# Patient Record
Sex: Female | Born: 1965 | Race: White | Hispanic: No | Marital: Married | Smoking: Never smoker
Health system: Southern US, Community
[De-identification: ages and names within clinical notes are randomized; demographics above are authoritative.]

## PROBLEM LIST (undated history)

## (undated) DIAGNOSIS — E7800 Pure hypercholesterolemia, unspecified: Secondary | ICD-10-CM

## (undated) DIAGNOSIS — Z9884 Bariatric surgery status: Secondary | ICD-10-CM

## (undated) DIAGNOSIS — E663 Overweight: Secondary | ICD-10-CM

## (undated) DIAGNOSIS — F3132 Bipolar disorder, current episode depressed, moderate: Secondary | ICD-10-CM

## (undated) DIAGNOSIS — K562 Volvulus: Secondary | ICD-10-CM

## (undated) DIAGNOSIS — R748 Abnormal levels of other serum enzymes: Secondary | ICD-10-CM

## (undated) DIAGNOSIS — K219 Gastro-esophageal reflux disease without esophagitis: Secondary | ICD-10-CM

## (undated) HISTORY — DX: Gastro-esophageal reflux disease without esophagitis: K21.9

## (undated) HISTORY — DX: Bariatric surgery status: Z98.84

## (undated) HISTORY — PX: BREAST EXCISIONAL BIOPSY: SUR124

## (undated) HISTORY — DX: Abnormal levels of other serum enzymes: R74.8

## (undated) HISTORY — PX: OTHER SURGICAL HISTORY: SHX169

## (undated) HISTORY — PX: APPENDECTOMY: SHX54

## (undated) HISTORY — DX: Overweight: E66.3

## (undated) HISTORY — DX: Pure hypercholesterolemia, unspecified: E78.00

## (undated) HISTORY — DX: Volvulus: K56.2

## (undated) HISTORY — DX: Bipolar disorder, current episode depressed, moderate: F31.32

## (undated) HISTORY — PX: CHOLECYSTECTOMY: SHX55

## (undated) HISTORY — PX: ABDOMINAL HYSTERECTOMY: SHX81

## (undated) HISTORY — PX: HERNIA REPAIR: SHX51

## (undated) HISTORY — PX: PATELLA REALIGNMENT: SHX2179

---

## 2008-09-17 HISTORY — PX: GASTRIC BYPASS: SHX52

## 2011-05-14 ENCOUNTER — Ambulatory Visit: Payer: Self-pay | Admitting: Internal Medicine

## 2013-02-02 HISTORY — PX: COLONOSCOPY W/ BIOPSIES: SHX1374

## 2013-03-20 ENCOUNTER — Ambulatory Visit: Payer: Self-pay | Admitting: Family Medicine

## 2013-06-10 DIAGNOSIS — K562 Volvulus: Secondary | ICD-10-CM

## 2013-06-10 HISTORY — DX: Volvulus: K56.2

## 2014-04-22 DIAGNOSIS — F3132 Bipolar disorder, current episode depressed, moderate: Secondary | ICD-10-CM | POA: Insufficient documentation

## 2014-04-22 HISTORY — DX: Bipolar disorder, current episode depressed, moderate: F31.32

## 2014-10-02 DIAGNOSIS — Z9884 Bariatric surgery status: Secondary | ICD-10-CM | POA: Insufficient documentation

## 2014-10-14 DIAGNOSIS — Z9884 Bariatric surgery status: Secondary | ICD-10-CM | POA: Insufficient documentation

## 2014-10-14 HISTORY — DX: Bariatric surgery status: Z98.84

## 2014-11-14 HISTORY — PX: ESOPHAGOGASTRODUODENOSCOPY: SHX1529

## 2015-03-11 ENCOUNTER — Other Ambulatory Visit: Payer: Self-pay | Admitting: Family Medicine

## 2015-03-11 DIAGNOSIS — Z1231 Encounter for screening mammogram for malignant neoplasm of breast: Secondary | ICD-10-CM

## 2015-03-25 ENCOUNTER — Ambulatory Visit: Payer: Self-pay

## 2015-03-31 ENCOUNTER — Other Ambulatory Visit: Payer: Self-pay | Admitting: Family Medicine

## 2015-03-31 ENCOUNTER — Ambulatory Visit
Admission: RE | Admit: 2015-03-31 | Discharge: 2015-03-31 | Disposition: A | Discharge status: Home / Self Care | Payer: Medicare Other | Source: Ambulatory Visit | Attending: Family Medicine | Admitting: Family Medicine

## 2015-03-31 DIAGNOSIS — Z1231 Encounter for screening mammogram for malignant neoplasm of breast: Secondary | ICD-10-CM | POA: Diagnosis not present

## 2015-04-03 DIAGNOSIS — R748 Abnormal levels of other serum enzymes: Secondary | ICD-10-CM

## 2015-04-03 DIAGNOSIS — E7800 Pure hypercholesterolemia, unspecified: Secondary | ICD-10-CM

## 2015-04-03 HISTORY — DX: Pure hypercholesterolemia, unspecified: E78.00

## 2015-04-03 HISTORY — DX: Abnormal levels of other serum enzymes: R74.8

## 2015-09-11 DIAGNOSIS — E663 Overweight: Secondary | ICD-10-CM

## 2015-09-11 DIAGNOSIS — Z90710 Acquired absence of both cervix and uterus: Secondary | ICD-10-CM | POA: Insufficient documentation

## 2015-09-11 HISTORY — DX: Overweight: E66.3

## 2016-02-23 ENCOUNTER — Other Ambulatory Visit: Payer: Self-pay | Admitting: Family Medicine

## 2016-02-23 DIAGNOSIS — Z1231 Encounter for screening mammogram for malignant neoplasm of breast: Secondary | ICD-10-CM

## 2016-03-31 ENCOUNTER — Ambulatory Visit: Payer: Medicare Other

## 2016-04-05 ENCOUNTER — Ambulatory Visit
Admission: RE | Admit: 2016-04-05 | Discharge: 2016-04-05 | Disposition: A | Discharge status: Home / Self Care | Payer: Medicare Other | Source: Ambulatory Visit | Attending: Family Medicine | Admitting: Family Medicine

## 2016-04-05 DIAGNOSIS — Z1231 Encounter for screening mammogram for malignant neoplasm of breast: Secondary | ICD-10-CM | POA: Insufficient documentation

## 2016-04-08 ENCOUNTER — Other Ambulatory Visit: Payer: Self-pay | Admitting: Family Medicine

## 2016-04-08 DIAGNOSIS — R928 Other abnormal and inconclusive findings on diagnostic imaging of breast: Secondary | ICD-10-CM

## 2016-04-14 ENCOUNTER — Ambulatory Visit: Payer: Medicare Other

## 2016-04-19 HISTORY — PX: BREAST EXCISIONAL BIOPSY: SUR124

## 2016-04-22 ENCOUNTER — Ambulatory Visit
Admission: RE | Admit: 2016-04-22 | Discharge: 2016-04-22 | Disposition: A | Discharge status: Home / Self Care | Payer: Medicare Other | Source: Ambulatory Visit | Attending: Family Medicine | Admitting: Family Medicine

## 2016-04-22 DIAGNOSIS — R928 Other abnormal and inconclusive findings on diagnostic imaging of breast: Secondary | ICD-10-CM

## 2016-04-23 ENCOUNTER — Other Ambulatory Visit: Payer: Self-pay | Admitting: Family Medicine

## 2016-04-23 DIAGNOSIS — R928 Other abnormal and inconclusive findings on diagnostic imaging of breast: Secondary | ICD-10-CM

## 2016-05-05 ENCOUNTER — Ambulatory Visit
Admission: RE | Admit: 2016-05-05 | Discharge: 2016-05-05 | Disposition: A | Discharge status: Home / Self Care | Payer: Medicare Other | Source: Ambulatory Visit | Attending: Family Medicine | Admitting: Family Medicine

## 2016-05-05 DIAGNOSIS — R928 Other abnormal and inconclusive findings on diagnostic imaging of breast: Secondary | ICD-10-CM

## 2016-05-05 HISTORY — PX: BREAST BIOPSY: SHX20

## 2016-05-06 ENCOUNTER — Ambulatory Visit: Payer: Medicare Other

## 2016-05-06 LAB — SURGICAL PATHOLOGY

## 2016-05-07 ENCOUNTER — Telehealth: Payer: Self-pay | Admitting: Surgery

## 2016-05-07 NOTE — Telephone Encounter (Signed)
Patient had a needle biopsy and now they want to remove more tissue. What does this involve. Not a patient here yet. Please call.

## 2016-05-10 ENCOUNTER — Other Ambulatory Visit: Payer: Self-pay

## 2016-05-10 ENCOUNTER — Ambulatory Visit (INDEPENDENT_AMBULATORY_CARE_PROVIDER_SITE_OTHER): Payer: Medicare Other | Admitting: General Surgery

## 2016-05-10 ENCOUNTER — Encounter: Payer: Self-pay | Admitting: General Surgery

## 2016-05-10 VITALS — BP 115/78 | HR 105 | Temp 98.5°F | Ht 70.0 in | Wt 166.2 lb

## 2016-05-10 DIAGNOSIS — N6320 Unspecified lump in the left breast, unspecified quadrant: Secondary | ICD-10-CM | POA: Diagnosis not present

## 2016-05-10 NOTE — Progress Notes (Signed)
Patient ID: Brandi Lawson, female   DOB: 01/16/1966, 51 y.o.   MRN: [id number]  CC: left breast mass  HPI Brandi Lawson BargeLynne Lawson is a 51 y.o. female who presents to clinic today for evaluation of a left breast mass. Patient reports that she had a screening mammogram earlier this year which showed an unusual finding of the left breast. It was subsequently biopsied. The biopsy returned benign tissue without evidence of hyperplasia. This was deemed to be a disconcordant finding and she was referred here for discussion of a surgical excision of the area. Patient reports that she has never felt a lump or mass in the area of concern. Her only previous breast biopsy was on the opposite breast. She denies any known family history of breast cancer. She denies any fevers, chills, nausea, vomiting, chest pain, shortness of breath, diarrhea, constipation. She is status post bariatric surgery and has lost over 100 pounds. She is otherwise in her usual state of health.  HPI  Past Medical History:  Diagnosis Date  . Bipolar affective disorder, depressed, moderate (HCC) 04/22/2014  . 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 Elevated liver enzymes 04/03/2015  . GERD (gastroesophageal reflux disease)   . Hypercholesterolemia 04/03/2015  . Overweight (BMI 25.0-29.9) 09/11/2015  . 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 ABDOMINAL HYSTERECTOMY     Not due to Cancer  . APPENDECTOMY    . BREAST EXCISIONAL BIOPSY Right 6+yrs ago   neg  . CHOLECYSTECTOMY    . Colocutaneous Fistula Closure    . COLONOSCOPY W/ BIOPSIES  02/02/2013   [facility]- Dr. Doy HutchingMcGreal  . ESOPHAGOGASTRODUODENOSCOPY  11/14/2014   [facility]- Dr. IraqSudan  . GASTRIC BYPASS  09/2008  . PATELLA REALIGNMENT Right     Family History  Problem Relation Age of Onset  . Hyperlipidemia Mother   . Hypertension Mother   . Thyroid disease Mother   . Diabetes Father   . Bipolar  disorder Father   . Hyperlipidemia Father   . Hypertension Father   . Thyroid disease Father   . Congenital heart disease Brother   . Ulcerative colitis Daughter   . Bipolar disorder Paternal Aunt   . Diabetes Paternal Aunt   . Bipolar disorder Paternal Uncle   . Prostate cancer Maternal Grandfather   . Diabetes Maternal Grandfather   . Prostate cancer Paternal Grandfather   . Bipolar disorder Paternal Grandfather   . Anesthesia problems Neg Hx   . Malignant hypertension Neg Hx     Social History Social History  Substance Use Topics  . Smoking status: Never Smoker  . Smokeless tobacco: Never Used  . Alcohol use No    Allergies  Allergen Reactions  . Other Anaphylaxis    Bee stings  . Gluten Meal Other (See Comments)    sensitivity  . Codeine Other (See Comments)    Causes Anger  . Hydrocodone Itching  . Meperidine Itching    Current Outpatient Prescriptions  Medication Sig Dispense Refill  . ARIPiprazole (ABILIFY) 5 MG tablet Take 5 mg by mouth daily.    . cholecalciferol (VITAMIN D) 1000 units tablet Take 1,000 Units by mouth daily.    . ferrous sulfate 325 (65 FE) MG EC tablet Take 325 mg by mouth 3 (three) times daily with meals.    Marland Kitchen. FETZIMA 80 MG CP24 Take 1 capsule by mouth daily.  8  . gabapentin (NEURONTIN) 600 MG tablet Take  600 mg by mouth 3 (three) times daily.  6  . LORazepam (ATIVAN) 0.5 MG tablet Take 1 tablet by mouth 2 (two) times daily as needed.  0  . omeprazole (PRILOSEC) 40 MG capsule Take 1 capsule by mouth 2 (two) times daily.  99  . QUEtiapine (SEROQUEL) 50 MG tablet Take 1-3 tablets by mouth at bedtime as needed.  3  . simvastatin (ZOCOR) 10 MG tablet Take 10 mg by mouth at bedtime.  99  . vitamin A 40981 UNIT capsule Take 10,000 Units by mouth daily.    . Vitamin K, Phytonadione, 100 MCG TABS Take by mouth.     No current facility-administered medications for this visit.      Review of Systems A Multi-point review of systems was asked  and was negative except for the findings documented in the history of present illness  Physical Exam Blood pressure 115/78, pulse (!) 105, temperature 98.5 F (36.9 C), temperature source Oral, height 5\' 10"  (1.778 m), weight 75.4 kg (166 lb 3.2 oz). CONSTITUTIONAL: No acute distress. EYES: Pupils are equal, round, and reactive to light, Sclera are non-icteric. EARS, NOSE, MOUTH AND THROAT: The oropharynx is clear. The oral mucosa is pink and moist. Hearing is intact to voice. LYMPH NODES:  Lymph nodes in the neck are normal. BREAST: Bilateral breast examined. Evidence of previous biopsy seen on the left lateral breast. There are no palpable dominant lesions or masses on either breast or axilla.3 RESPIRATORY:  Lungs are clear. There is normal respiratory effort, with equal breath sounds bilaterally, and without pathologic use of accessory muscles. CARDIOVASCULAR: Heart is regular without murmurs, gallops, or rubs. GI: The abdomen is soft, nontender, and nondistended. There are no palpable masses. There is no hepatosplenomegaly. There are normal bowel sounds in all quadrants. GU: Rectal deferred.   MUSCULOSKELETAL: Normal muscle strength and tone. No cyanosis or edema.   SKIN: Turgor is good and there are no pathologic skin lesions or ulcers. NEUROLOGIC: Motor and sensation is grossly normal. Cranial nerves are grossly intact. PSYCH:  Oriented to person, place and time. Affect is normal.  Data Reviewed Images and labs reviewed. Labs do show an area of increased density to the left breast. Pathology from the biopsy revealed focal dense fibrosis that was negative for proliferative changes, atypia, malignancy. I have personally reviewed the patient's imaging, laboratory findings and medical records.    Assessment    Left breast mass    Plan    51 year old female with a left breast mass seen on imaging with needle biopsy thought to be disconcordant. Given this a surgical excision was  recommended. The procedure of a wire localized lumpectomy was described in detail to include the risks, benefits, alternatives. The possible diagnoses were described to the patient in detail. She voiced understanding and desires to proceed as soon as possible due to an upcoming temporary move to support her daughter. Plan will be to proceed to the operating room on Thursday the 25th for needle localized left-sided biopsy.     Time spent with the patient was 45 minutes, with more than 50% of the time spent in face-to-face education, counseling and care coordination.     Ricarda Frame, MD FACS General Surgeon 05/10/2016, 3:38 PM

## 2016-05-10 NOTE — Telephone Encounter (Signed)
Spoke with Dr. Tonita CongWoodham. He would like to see patient at 3pm this afternoon and discuss surgery for later this week.  Patient placed on schedule and patient has been given all appointment details including directions to office.

## 2016-05-10 NOTE — Patient Instructions (Signed)
We have spoken today about removing a lump in your breast. We will have this done on 05/13/16 by Dr[facility].  You will most likely be able to leave the hospital several hours after your surgery.  Plan to tenatively be off work for 1-2 weeks following the surgery and may return with approximately 4 more weeks of a lifting restriction, no greater than 15 lbs.    Lumpectomy A lumpectomy is a form of "breast conserving" or "breast preservation" surgery. It may also be referred to as a partial mastectomy. During a lumpectomy, the portion of the breast that contains the cancerous tumor or breast mass (the lump) is removed. Some normal tissue around the lump may also be removed to make sure all of the tumor has been removed.  LET [facility] PROVID[facility] KNOW ABOUT:  Any allergies you have.  All medicines you are taking, including vitamins, herbs, eye drops, creams, and over-the-counter medicines.  Previous problems you or members of your family have had with the use of anesthetics.  Any blood disorders you have.  Previous surgeries you have had.  Medical conditions you have. RISKS AND COMPLICATIONS Generally, this is a safe procedure. However, problems can occur and include:  Bleeding.  Infection.  Pain.  Temporary swelling.  Change in the shape of the breast, particularly if a large portion is removed. BEFORE THE PROCEDURE  Ask your health care provider about changing or stopping your regular medicines. This is especially important if you are taking diabetes medicines or blood thinners.  Do not eat or drink anything after midnight on the night before the procedure or as directed by your health care provider. Ask your health care provider if you can take a sip of water with any approved medicines.  On the day of surgery, your health care provider will use a mammogram or ultrasound to locate and mark the tumor in your breast. These markings on your breast will show where  the cut (incision) will be made. PROCEDURE   An IV tube will be put into one of your veins.  You may be given medicine to help you relax before the surgery (sedative). You will be given one of the following:  A medicine that numbs the area (local anesthetic).  A medicine that makes you fall asleep (general anesthetic).  Your health care provider will use a kind of electric scalpel that uses heat to minimize bleeding (electrocautery knife).  A curved incision (like a smile or frown) that follows the natural curve of your breast is made, to allow for minimal scarring and better healing.  The tumor will be removed with some of the surrounding tissue. This will be sent to the lab for analysis. Your health care provider may also remove your lymph nodes at this time if needed.  Sometimes, but not always, a rubber tube called a drain will be surgically inserted into your breast area or armpit to collect excess fluid that may accumulate in the space where the tumor was. This drain is connected to a plastic bulb on the outside of your body. This drain creates suction to help remove the fluid.  The incisions will be closed with stitches (sutures).  A bandage may be placed over the incisions. AFTER THE PROCEDURE  You will be taken to the recovery area.  You will be given medicine for pain.  A small rubber drain may be placed in the breast for 2-3 days to prevent a collection of blood (hematoma) from  developing in the breast. You will be given instructions on caring for the drain before you go home.  A pressure bandage (dressing) will be applied for 1-2 days to prevent bleeding. Ask your health care provider how to care for your bandage at home.   This information is not intended to replace advice given to you by your health care provider. Make sure you discuss any questions you have with your health care provider.   Document Released: 05/17/2006 Document Revised: 04/26/2014 Document Reviewed:  09/08/2012 Elsevier Interactive Patient Education [employer].

## 2016-05-10 NOTE — Telephone Encounter (Signed)
Spoke with patient at this time. She would like to have this taken care of as soon as possible. However, she will be leaving to go out of town indefinitely on 06/01/16 and would like this taken care of prior to that. (Her daughter is delivering grandchild soon and son-in-law is deployed).  Spoke with Dr. Tonita CongWoodham in regards to seeing patient today or tomorrow in office and putting on surgical schedule later this week. He will call me back after he has decided on plan.

## 2016-05-11 ENCOUNTER — Other Ambulatory Visit: Payer: Self-pay | Admitting: General Surgery

## 2016-05-11 ENCOUNTER — Encounter
Admission: RE | Admit: 2016-05-11 | Discharge: 2016-05-11 | Disposition: A | Discharge status: Home / Self Care | Payer: Medicare Other | Source: Ambulatory Visit | Attending: General Surgery | Admitting: General Surgery

## 2016-05-11 ENCOUNTER — Other Ambulatory Visit: Payer: Self-pay

## 2016-05-11 ENCOUNTER — Ambulatory Visit
Admission: RE | Admit: 2016-05-11 | Discharge: 2016-05-11 | Disposition: A | Discharge status: Home / Self Care | Payer: Medicare Other | Source: Ambulatory Visit | Attending: General Surgery | Admitting: General Surgery

## 2016-05-11 ENCOUNTER — Telehealth: Payer: Self-pay | Admitting: General Surgery

## 2016-05-11 DIAGNOSIS — N6320 Unspecified lump in the left breast, unspecified quadrant: Secondary | ICD-10-CM | POA: Diagnosis not present

## 2016-05-11 DIAGNOSIS — Z01810 Encounter for preprocedural cardiovascular examination: Secondary | ICD-10-CM

## 2016-05-11 DIAGNOSIS — Z0183 Encounter for blood typing: Secondary | ICD-10-CM | POA: Diagnosis not present

## 2016-05-11 DIAGNOSIS — Z01811 Encounter for preprocedural respiratory examination: Secondary | ICD-10-CM

## 2016-05-11 DIAGNOSIS — Z01818 Encounter for other preprocedural examination: Secondary | ICD-10-CM | POA: Diagnosis present

## 2016-05-11 DIAGNOSIS — N63 Unspecified lump in unspecified breast: Secondary | ICD-10-CM

## 2016-05-11 LAB — PROTIME-INR
INR: 1.05
PROTHROMBIN TIME: 13.7 s (ref 11.4–15.2)

## 2016-05-11 LAB — COMPREHENSIVE METABOLIC PANEL
ALK PHOS: 95 U/L (ref 38–126)
ALT: 60 U/L — AB (ref 14–54)
AST: 56 U/L — ABNORMAL HIGH (ref 15–41)
Albumin: 4.3 g/dL (ref 3.5–5.0)
Anion gap: 7 (ref 5–15)
BILIRUBIN TOTAL: 0.7 mg/dL (ref 0.3–1.2)
BUN: 16 mg/dL (ref 6–20)
CALCIUM: 8.9 mg/dL (ref 8.9–10.3)
CO2: 29 mmol/L (ref 22–32)
Chloride: 104 mmol/L (ref 101–111)
Creatinine, Ser: 0.6 mg/dL (ref 0.44–1.00)
GFR calc Af Amer: >60 mL/min (ref >60)
Glucose, Bld: 108 mg/dL — ABNORMAL HIGH (ref 65–99)
Potassium: 3.7 mmol/L (ref 3.5–5.1)
Sodium: 140 mmol/L (ref 135–145)
TOTAL PROTEIN: 7.5 g/dL (ref 6.5–8.1)

## 2016-05-11 LAB — IRON AND TIBC
Iron: 51 ug/dL (ref 28–170)
Saturation Ratios: 12 % (ref 10.4–31.8)
TIBC: 440 ug/dL (ref 250–450)
UIBC: 389 ug/dL

## 2016-05-11 LAB — CBC WITH DIFFERENTIAL/PLATELET
Basophils Absolute: 0 10*3/uL (ref 0–0.1)
Basophils Relative: 1 %
Eosinophils Absolute: 0.1 10*3/uL (ref 0–0.7)
Eosinophils Relative: 2 %
HCT: 38.8 % (ref 35.0–47.0)
HEMOGLOBIN: 13 g/dL (ref 12.0–16.0)
LYMPHS ABS: 1.7 10*3/uL (ref 1.0–3.6)
LYMPHS PCT: 27 %
MCH: 29.1 pg (ref 26.0–34.0)
MCHC: 33.5 g/dL (ref 32.0–36.0)
MCV: 86.8 fL (ref 80.0–100.0)
MONOS PCT: 6 %
Monocytes Absolute: 0.4 10*3/uL (ref 0.2–0.9)
NEUTROS PCT: 64 %
Neutro Abs: 3.9 10*3/uL (ref 1.4–6.5)
Platelets: 283 10*3/uL (ref 150–440)
RBC: 4.47 MIL/uL (ref 3.80–5.20)
RDW: 13.1 % (ref 11.5–14.5)
WBC: 6.1 10*3/uL (ref 3.6–11.0)

## 2016-05-11 LAB — APTT: aPTT: 33 seconds (ref 24–36)

## 2016-05-11 NOTE — Patient Instructions (Signed)
Your procedure is scheduled on: Thursday 05/13/16 [facility] AT 8:15 AM    Remember: Instructions that are not followed completely may result in serious medical risk, up to and including death, or upon the discretion of your surgeon and anesthesiologist your surgery may need to be rescheduled.    __X__ 1. Do not eat food or drink liquids after midnight. No gum chewing or hard candies.     __X__ 2. No Alcohol for 24 hours before or after surgery.   ____ 3. Bring all medications with you on the day of surgery if instructed.    __X__ 4. Notify your doctor if there is any change in your medical condition     (cold, fever, infections).             ___X__5. No smoking within 24 hours of your surgery.     Do not wear jewelry, make-up, hairpins, clips or nail polish.  Do not wear lotions, powders, or perfumes.   Do not shave 48 hours prior to surgery. Men may shave face and neck.  Do not bring valuables to the hospital.    [facility] is not responsible for any belongings or valuables.               Contacts, dentures or bridgework may not be worn into surgery.  Leave your suitcase in the car. After surgery it may be brought to your room.  For patients admitted to the hospital, discharge time is determined by your                treatment team.   Patients discharged the day of surgery will not be allowed to drive home.   Please read over the following fact sheets that you were given:   Pain Booklet and MRSA Information   __X_ Take these medicines the morning of surgery with A SIP OF WATER:    1. GABAPENTIN  2. FETZIMA  3. OMPERAZOLE  4.   5.  6.  ____ Fleet Enema (as directed)   __X__ Use CHG Soap as directed  ____ Use inhalers on the day of surgery  ____ Stop metformin 2 days prior to surgery    ____ Take 1/2 of usual insulin dose the night before surgery and none on the morning of surgery.   ____ Stop Coumadin/Plavix/aspirin on   __X__ Stop  Anti-inflammatories such as Advil, Aleve, Ibuprofen, Motrin, Naproxen, Naprosyn, Goodies,powder, or aspirin products.  OK to take Tylenol.   ____ Stop supplements until after surgery.    ____ Bring C-Pap to the hospital.

## 2016-05-11 NOTE — Telephone Encounter (Signed)
Pt advised of pre op date/time and sx date. Sx: 05/13/16 with Dr Ambrose MantleWoodham--Left lumpectomy with Needle localization.  Pre op: 05/11/16 @ 1:00pm--Office.   Patient made aware to arrive at 8:15am the day of surgery [facility].   Patient understands all directions.

## 2016-05-12 MED ORDER — CEFAZOLIN SODIUM-DEXTROSE 2-4 GM/100ML-% IV SOLN
2.0000 g | INTRAVENOUS | Status: AC
  Administered 2016-05-13: 2 g via INTRAVENOUS

## 2016-05-13 ENCOUNTER — Ambulatory Visit
Admission: RE | Admit: 2016-05-13 | Discharge: 2016-05-13 | Disposition: A | Discharge status: Home / Self Care | Payer: Medicare Other | Source: Ambulatory Visit | Attending: General Surgery | Admitting: General Surgery

## 2016-05-13 ENCOUNTER — Encounter: Payer: Self-pay | Admitting: Anesthesiology

## 2016-05-13 ENCOUNTER — Ambulatory Visit: Payer: Medicare Other | Admitting: Certified Registered"

## 2016-05-13 ENCOUNTER — Encounter
Admission: RE | Disposition: A | Discharge status: Home / Self Care | Payer: Self-pay | Source: Ambulatory Visit | Attending: General Surgery

## 2016-05-13 DIAGNOSIS — N6320 Unspecified lump in the left breast, unspecified quadrant: Secondary | ICD-10-CM

## 2016-05-13 DIAGNOSIS — E7800 Pure hypercholesterolemia, unspecified: Secondary | ICD-10-CM | POA: Diagnosis not present

## 2016-05-13 DIAGNOSIS — K219 Gastro-esophageal reflux disease without esophagitis: Secondary | ICD-10-CM | POA: Insufficient documentation

## 2016-05-13 DIAGNOSIS — F319 Bipolar disorder, unspecified: Secondary | ICD-10-CM | POA: Diagnosis not present

## 2016-05-13 DIAGNOSIS — F329 Major depressive disorder, single episode, unspecified: Secondary | ICD-10-CM | POA: Diagnosis not present

## 2016-05-13 DIAGNOSIS — N6012 Diffuse cystic mastopathy of left breast: Secondary | ICD-10-CM | POA: Insufficient documentation

## 2016-05-13 DIAGNOSIS — Z9884 Bariatric surgery status: Secondary | ICD-10-CM | POA: Insufficient documentation

## 2016-05-13 HISTORY — PX: BREAST LUMPECTOMY WITH NEEDLE LOCALIZATION: SHX5759

## 2016-05-13 SURGERY — BREAST LUMPECTOMY WITH NEEDLE LOCALIZATION
Anesthesia: General | Laterality: Left | Wound class: Clean

## 2016-05-13 MED ORDER — DEXAMETHASONE SODIUM PHOSPHATE 10 MG/ML IJ SOLN
INTRAMUSCULAR | Status: DC | PRN
  Administered 2016-05-13: 4 mg via INTRAVENOUS

## 2016-05-13 MED ORDER — DOCUSATE SODIUM 100 MG PO CAPS: 100.0000 mg | ORAL_CAPSULE | Freq: Two times a day (BID) | ORAL | 0 refills | Status: DC

## 2016-05-13 MED ORDER — ONDANSETRON 4 MG PO TBDP: 4.0000 mg | ORAL_TABLET | Freq: Three times a day (TID) | ORAL | 0 refills | Status: DC | PRN

## 2016-05-13 MED ORDER — ONDANSETRON HCL 4 MG/2ML IJ SOLN
INTRAMUSCULAR | Status: DC | PRN
  Administered 2016-05-13: 4 mg via INTRAVENOUS

## 2016-05-13 MED ORDER — LIDOCAINE HCL (PF) 1 % IJ SOLN
INTRAMUSCULAR | Status: AC
  Filled 2016-05-13: qty 30

## 2016-05-13 MED ORDER — CHLORHEXIDINE GLUCONATE CLOTH 2 % EX PADS: 6.0000 | MEDICATED_PAD | Freq: Once | CUTANEOUS | Status: DC

## 2016-05-13 MED ORDER — FENTANYL CITRATE (PF) 100 MCG/2ML IJ SOLN
INTRAMUSCULAR | Status: DC | PRN
  Administered 2016-05-13: 50 ug via INTRAVENOUS
  Administered 2016-05-13: 25 ug via INTRAVENOUS

## 2016-05-13 MED ORDER — BUPIVACAINE HCL (PF) 0.5 % IJ SOLN
INTRAMUSCULAR | Status: AC
  Filled 2016-05-13: qty 30

## 2016-05-13 MED ORDER — ONDANSETRON HCL 4 MG/2ML IJ SOLN
INTRAMUSCULAR | Status: AC
  Filled 2016-05-13: qty 2

## 2016-05-13 MED ORDER — DEXAMETHASONE SODIUM PHOSPHATE 10 MG/ML IJ SOLN
INTRAMUSCULAR | Status: AC
  Filled 2016-05-13: qty 1

## 2016-05-13 MED ORDER — OXYCODONE-ACETAMINOPHEN 2.5-325 MG PO TABS: 1.0000 | ORAL_TABLET | ORAL | 0 refills | Status: DC | PRN

## 2016-05-13 MED ORDER — HEPARIN SODIUM (PORCINE) 1000 UNIT/ML IJ SOLN
INTRAMUSCULAR | Status: AC
  Filled 2016-05-13: qty 1

## 2016-05-13 MED ORDER — ACETAMINOPHEN 10 MG/ML IV SOLN
INTRAVENOUS | Status: AC
  Filled 2016-05-13: qty 100

## 2016-05-13 MED ORDER — FENTANYL CITRATE (PF) 100 MCG/2ML IJ SOLN: 25.0000 ug | INTRAMUSCULAR | Status: DC | PRN

## 2016-05-13 MED ORDER — LIDOCAINE HCL (CARDIAC) 20 MG/ML IV SOLN
INTRAVENOUS | Status: DC | PRN
  Administered 2016-05-13: 80 mg via INTRAVENOUS

## 2016-05-13 MED ORDER — EPINEPHRINE PF 1 MG/ML IJ SOLN
INTRAMUSCULAR | Status: AC
  Filled 2016-05-13: qty 1

## 2016-05-13 MED ORDER — KETOROLAC TROMETHAMINE 30 MG/ML IJ SOLN
INTRAMUSCULAR | Status: AC
  Filled 2016-05-13: qty 1

## 2016-05-13 MED ORDER — CEFAZOLIN SODIUM-DEXTROSE 2-4 GM/100ML-% IV SOLN
INTRAVENOUS | Status: AC
  Administered 2016-05-13: 2 g via INTRAVENOUS
  Filled 2016-05-13: qty 100

## 2016-05-13 MED ORDER — MIDAZOLAM HCL 2 MG/2ML IJ SOLN
INTRAMUSCULAR | Status: AC
  Filled 2016-05-13: qty 2

## 2016-05-13 MED ORDER — FENTANYL CITRATE (PF) 100 MCG/2ML IJ SOLN
INTRAMUSCULAR | Status: AC
  Filled 2016-05-13: qty 2

## 2016-05-13 MED ORDER — PROPOFOL 10 MG/ML IV BOLUS
INTRAVENOUS | Status: DC | PRN
  Administered 2016-05-13: 150 mg via INTRAVENOUS

## 2016-05-13 MED ORDER — PROPOFOL 10 MG/ML IV BOLUS
INTRAVENOUS | Status: AC
  Filled 2016-05-13: qty 20

## 2016-05-13 MED ORDER — ACETAMINOPHEN 10 MG/ML IV SOLN
INTRAVENOUS | Status: DC | PRN
  Administered 2016-05-13: 1000 mg via INTRAVENOUS

## 2016-05-13 MED ORDER — MIDAZOLAM HCL 2 MG/2ML IJ SOLN
INTRAMUSCULAR | Status: DC | PRN
  Administered 2016-05-13: 2 mg via INTRAVENOUS

## 2016-05-13 MED ORDER — LACTATED RINGERS IV SOLN
INTRAVENOUS | Status: DC
  Administered 2016-05-13: 10:00:00 via INTRAVENOUS

## 2016-05-13 MED ORDER — KETOROLAC TROMETHAMINE 30 MG/ML IJ SOLN
INTRAMUSCULAR | Status: DC | PRN
  Administered 2016-05-13: 30 mg via INTRAVENOUS

## 2016-05-13 MED ORDER — LIDOCAINE-EPINEPHRINE (PF) 1 %-1:200000 IJ SOLN
INTRAMUSCULAR | Status: DC | PRN
  Administered 2016-05-13: 15 mL

## 2016-05-13 MED ORDER — BUPIVACAINE HCL (PF) 0.5 % IJ SOLN
INTRAMUSCULAR | Status: DC | PRN
  Administered 2016-05-13: 15 mL

## 2016-05-13 MED ORDER — LIDOCAINE HCL (PF) 2 % IJ SOLN
INTRAMUSCULAR | Status: AC
  Filled 2016-05-13: qty 2

## 2016-05-13 SURGICAL SUPPLY — 29 items
BLADE SURG 15 STRL LF DISP TIS (BLADE) ×1 IMPLANT
BLADE SURG 15 STRL SS (BLADE) ×2
CANISTER SUCT 1200ML W/VALVE (MISCELLANEOUS) ×3 IMPLANT
CHLORAPREP W/TINT 26ML (MISCELLANEOUS) ×3 IMPLANT
COVER PROBE FLX POLY STRL (MISCELLANEOUS) ×3 IMPLANT
DEVICE DUBIN SPECIMEN MAMMOGRA (MISCELLANEOUS) ×3 IMPLANT
DRAPE LAPAROTOMY TRNSV 106X77 (MISCELLANEOUS) ×3 IMPLANT
ELECT CAUTERY BLADE 6.4 (BLADE) ×3 IMPLANT
ELECT REM PT RETURN 9FT ADLT (ELECTROSURGICAL) ×3
ELECTRODE REM PT RTRN 9FT ADLT (ELECTROSURGICAL) ×1 IMPLANT
GLOVE BIO SURGEON STRL SZ7.5 (GLOVE) ×3 IMPLANT
GLOVE INDICATOR 8.0 STRL GRN (GLOVE) ×3 IMPLANT
GOWN STRL REUS W/ TWL LRG LVL3 (GOWN DISPOSABLE) ×1 IMPLANT
GOWN STRL REUS W/ TWL XL LVL3 (GOWN DISPOSABLE) ×1 IMPLANT
GOWN STRL REUS W/TWL LRG LVL3 (GOWN DISPOSABLE) ×2
GOWN STRL REUS W/TWL XL LVL3 (GOWN DISPOSABLE) ×2
LABEL OR SOLS (LABEL) ×3 IMPLANT
LIQUID BAND (GAUZE/BANDAGES/DRESSINGS) ×3 IMPLANT
MARGIN MAP 10MM (MISCELLANEOUS) ×3 IMPLANT
NEEDLE HYPO 25X1 1.5 SAFETY (NEEDLE) ×3 IMPLANT
PACK BASIN MINOR ARMC (MISCELLANEOUS) ×3 IMPLANT
SUT MNCRL 4-0 (SUTURE) ×2
SUT MNCRL 4-0 27XMFL (SUTURE) ×1
SUT SILK 2 0 SH (SUTURE) ×3 IMPLANT
SUT VIC AB 3-0 SH 27 (SUTURE) ×2
SUT VIC AB 3-0 SH 27X BRD (SUTURE) ×1 IMPLANT
SUTURE MNCRL 4-0 27XMF (SUTURE) ×1 IMPLANT
SYRINGE 10CC LL (SYRINGE) ×3 IMPLANT
WATER STERILE IRR 1000ML POUR (IV SOLUTION) ×3 IMPLANT

## 2016-05-13 NOTE — Brief Op Note (Signed)
05/13/2016  11:16 AM  PATIENT:  Brandi Lawson  51 y.o. female  PRE-OPERATIVE DIAGNOSIS:  left breast mass  POST-OPERATIVE DIAGNOSIS:  left breast mass  PROCEDURE:  Procedure(s): BREAST LUMPECTOMY WITH NEEDLE LOCALIZATION (Left)  SURGEON:  Surgeon(s) and Role:    * Ricarda Frameharles Symeon Puleo, MD - Primary  PHYSICIAN ASSISTANT:   ASSISTANTS: PA student   ANESTHESIA:   general  EBL:  No intake/output data recorded.  BLOOD ADMINISTERED:none  DRAINS: none   LOCAL MEDICATIONS USED:  MARCAINE   , XYLOCAINE  and Amount: 30 ml  SPECIMEN:  Source of Specimen:  left breast mass  DISPOSITION OF SPECIMEN:  PATHOLOGY  COUNTS:  YES  TOURNIQUET:  * No tourniquets in log *  DICTATION: .Dragon Dictation  PLAN OF CARE: Discharge to home after PACU  PATIENT DISPOSITION:  PACU - hemodynamically stable.   Delay start of Pharmacological VTE agent (>24hrs) due to surgical blood loss or risk of bleeding: no

## 2016-05-13 NOTE — Anesthesia Postprocedure Evaluation (Signed)
Anesthesia Post Note  Patient: Brandi Lawson  Procedure(s) Performed: Procedure(s) (LRB): BREAST LUMPECTOMY WITH NEEDLE LOCALIZATION (Left)  Patient location during evaluation: PACU Anesthesia Type: General Level of consciousness: awake and alert Pain management: pain level controlled Vital Signs Assessment: post-procedure vital signs reviewed and stable Respiratory status: spontaneous breathing, nonlabored ventilation, respiratory function stable and patient connected to nasal cannula oxygen Cardiovascular status: blood pressure returned to baseline and stable Postop Assessment: no signs of nausea or vomiting Anesthetic complications: no     Last Vitals:  Vitals:   05/13/16 1144 05/13/16 1155  BP: 119/75 124/66  Pulse: 79   Resp: 18 18  Temp:  36.8 C    Last Pain:  Vitals:   05/13/16 1155  TempSrc:   PainSc: 0-No pain                 Cleda MccreedyJoseph K Lilleigh Hechavarria

## 2016-05-13 NOTE — Anesthesia Procedure Notes (Signed)
Procedure Name: LMA Insertion Performed by: Wenona Mayville Pre-anesthesia Checklist: Patient identified, Patient being monitored, Timeout performed, Emergency Drugs available and Suction available Patient Re-evaluated:Patient Re-evaluated prior to inductionOxygen Delivery Method: Circle system utilized Preoxygenation: Pre-oxygenation with 100% oxygen Intubation Type: IV induction LMA: LMA inserted LMA Size: 3.0 Tube type: Oral Number of attempts: 1 Placement Confirmation: positive ETCO2 and breath sounds checked- equal and bilateral Tube secured with: Tape Dental Injury: Teeth and Oropharynx as per pre-operative assessment        

## 2016-05-13 NOTE — Transfer of Care (Signed)
Immediate Anesthesia Transfer of Care Note  Patient: Brandi Lawson  Procedure(s) Performed: Procedure(s): BREAST LUMPECTOMY WITH NEEDLE LOCALIZATION (Left)  Patient Location: PACU  Anesthesia Type:General  Level of Consciousness: sedated  Airway & Oxygen Therapy: Patient Spontanous Breathing and Patient connected to face mask oxygen  Post-op Assessment: Report given to RN and Post -op Vital signs reviewed and stable  Post vital signs: Reviewed and stable  Last Vitals:  Vitals:   05/13/16 0927 05/13/16 1117  BP: 119/74 97/71  Pulse: (!) 59 61  Resp: 16 10  Temp: 37.1 C     Last Pain:  Vitals:   05/13/16 0927  TempSrc: Tympanic         Complications: No apparent anesthesia complications

## 2016-05-13 NOTE — Discharge Instructions (Signed)
Lumpectomy, Care After This sheet gives you information about how to care for yourself after your procedure. Your health care provider may also give you more specific instructions. If you have problems or questions, contact your health care provider. What can I expect after the procedure? After the procedure, it is common to have:  Breast swelling.  Breast tenderness.  Stiffness in your arm or shoulder.  A change in the shape and feel of your breast.  Scar tissue that feels hard to the touch in the area where the lump was removed. Follow these instructions at home: Bathing  Take sponge baths until your health care provider says that you can start showering or bathing. Okay to shower in 24 hours  Do not take baths, swim, or use a hot tub until your health care provider approves.   AMBULATORY SURGERY  DISCHARGE INSTRUCTIONS   1) The drugs that you were given will stay in your system until tomorrow so for the next 24 hours you should not:  A) Drive an automobile B) Make any legal decisions C) Drink any alcoholic beverage   2) You may resume regular meals tomorrow.  Today it is better to start with liquids and gradually work up to solid foods.  You may eat anything you prefer, but it is better to start with liquids, then soup and crackers, and gradually work up to solid foods.   3) Please notify your doctor immediately if you have any unusual bleeding, trouble breathing, redness and pain at the surgery site, drainage, fever, or pain not relieved by medication.    4) Additional Instructions:        Please contact your physician with any problems or Same Day Surgery at [phone], Monday through Friday 6 am to 4 pm, or [place] [facility] number at [phone]. Incision care  Follow instructions from your health care provider about how to take care of your incision. Make sure you:  Wash your hands with soap and water before you change your bandage  (dressing). If soap and water are not available, use hand sanitizer.  Change your dressing as told by your health care provider.  Leave stitches (sutures), skin glue, or adhesive strips in place. These skin closures may need to stay in place for 2 weeks or longer. If adhesive strip edges start to loosen and curl up, you may trim the loose edges. Do not remove adhesive strips completely unless your health care provider tells you to do that.  Check your incision area every day for signs of infection. Check for:  More redness, swelling, or pain.  More fluid or blood.  Warmth.  Pus or a bad smell.  Keep your dressing clean and dry.  If you were sent home with a surgical drain in place, follow instructions from your health care provider about emptying it. Activity  Return to your normal activities as told by your health care provider. Ask your health care provider what activities are safe for you.  Avoid activities that require a lot of energy (are strenuous).  Be careful to avoid any activities that could cause an injury to your arm on the side of your surgery.  Do not lift anything that is heavier than 10 lb (4.5 kg). Avoid lifting with the arm that is on the side of your surgery.  Do not carry heavy objects on your shoulder.  After your drain is removed, you should perform exercises to keep your arm from getting stiff and swollen. Talk  with your health care provider about which exercises are safe for you. General instructions  Take over-the-counter and prescription medicines only as told by your health care provider.  You may eat what you usually do.  Wear a supportive bra as told by your health care provider.  Raise (elevate) your arm above the level of your heart while you are sitting or lying down.  Do not wear tight jewelry on your arm, wrist, or fingers on the side of your surgery. Follow-up  Keep all follow-up visits as told by your health care provider. This is  important.  You may need to be screened for extra fluid around the lymph nodes (lymphedema). Follow instructions from your health care provider about how often you should be checked.  If you had any lymph nodes removed during your procedure, be sure to tell all of your health care providers. This is important information to share before you are involved in certain procedures, such as giving blood or having your blood pressure taken. Contact a health care provider if:  You develop a rash.  You have a fever.  Your pain medicine is not working.  Your swelling, weakness, or numbness in your arm has not improved after a few weeks.  You have new swelling in your breast or arm.  You have more redness, swelling, or pain in your incision area.  You have more fluid or blood coming from your incision.  Your incision feels warm to the touch.  You have pus or a bad smell coming from your incision. Get help right away if:  You have very bad pain in your breast or arm.  You have chest pain.  You have difficulty breathing. This information is not intended to replace advice given to you by your health care provider. Make sure you discuss any questions you have with your health care provider. Document Released: 04/21/2006 Document Revised: 12/17/2015 Document Reviewed: 12/17/2015 Elsevier Interactive Patient Education  2017 [employer]   AMBULATORY SURGERY  DISCHARGE INSTRUCTIONS   5) The drugs that you were given will stay in your system until tomorrow so for the next 24 hours you should not:  D) Drive an automobile E) Make any legal decisions F) Drink any alcoholic beverage   6) You may resume regular meals tomorrow.  Today it is better to start with liquids and gradually work up to solid foods.  You may eat anything you prefer, but it is better to start with liquids, then soup and crackers, and gradually work up to solid foods.   7) Please notify your doctor immediately if you  have any unusual bleeding, trouble breathing, redness and pain at the surgery site, drainage, fever, or pain not relieved by medication.    8) Additional Instructions:        Please contact your physician with any problems or Same Day Surgery at [phone], Monday through Friday 6 am to 4 pm, or [place] at [facility] number at [phone].

## 2016-05-13 NOTE — Anesthesia Preprocedure Evaluation (Addendum)
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   She reports that a dentist told her that it is loose. The patient reports that she can not appreciate the looseness :   Pulmonary neg pulmonary ROS, neg shortness of breath,    Pulmonary exam normal breath sounds clear to auscultation       Cardiovascular Exercise Tolerance: Good (-) angina(-) Past MI and (-) DOE negative cardio ROS Normal cardiovascular exam Rhythm:regular Rate:Normal     Neuro/Psych PSYCHIATRIC DISORDERS Depression Bipolar Disorder negative neurological ROS     GI/Hepatic Neg liver ROS, GERD  Controlled,  Endo/Other  negative endocrine ROS  Renal/GU      Musculoskeletal   Abdominal   Peds  Hematology negative hematology ROS (+)   Anesthesia Other Findings Past Medical History: 04/22/2014: Bipolar affective disorder, depressed, moderat* 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 Doy HutchingMcGreal 11/14/2014: ESOPHAGOGASTRODUODENOSCOPY     Comment: [facility]- Dr.  Ira[place]an 09/2008: GASTRIC BYPASS No date: HERNIA REPAIR No date: PATELLA REALIGNMENT Right     Reproductive/Obstetrics negative OB ROS                            Anesthesia Physical Anesthesia Plan  ASA: III  Anesthesia Plan: General LMA   Post-op Pain Management:    Induction:   Airway Management Planned:   Additional Equipment:   Intra-op Plan:   Post-operative Plan:   Informed Consent: I have reviewed the patients History and Physical, chart, labs and discussed the procedure including the risks, benefits and alternatives for the proposed anesthesia with the patient or authorized representative who has indicated his/her understanding and acceptance.   Dental Advisory Given  Plan Discussed with: Anesthesiologist, CRNA and Surgeon  Anesthesia Plan Comments:         Anesthesia Quick Evaluation

## 2016-05-13 NOTE — Interval H&P Note (Signed)
History and Physical Interval Note:  05/13/2016 9:47 AM  Brandi Lawson BargeLynne Skillern  has presented today for surgery, with the diagnosis of left breast mass  The various methods of treatment have been discussed with the patient and family. After consideration of risks, benefits and other options for treatment, the patient has consented to  Procedure(s): BREAST LUMPECTOMY WITH NEEDLE LOCALIZATION (Left) as a surgical intervention .  The patient's history has been reviewed, patient examined, no change in status, stable for surgery.  I have reviewed the patient's chart and labs.  Questions were answered to the patient's satisfaction.     Ricarda Frameharles Krist Rosenboom

## 2016-05-13 NOTE — H&P (View-Only) (Signed)
Patient ID: Brandi Lawson, female   DOB: 01/16/1966, 51 y.o.   MRN: [id number]  CC: left breast mass  HPI Brandi Lawson BargeLynne Lawson is a 51 y.o. female who presents to clinic today for evaluation of a left breast mass. Patient reports that she had a screening mammogram earlier this year which showed an unusual finding of the left breast. It was subsequently biopsied. The biopsy returned benign tissue without evidence of hyperplasia. This was deemed to be a disconcordant finding and she was referred here for discussion of a surgical excision of the area. Patient reports that she has never felt a lump or mass in the area of concern. Her only previous breast biopsy was on the opposite breast. She denies any known family history of breast cancer. She denies any fevers, chills, nausea, vomiting, chest pain, shortness of breath, diarrhea, constipation. She is status post bariatric surgery and has lost over 100 pounds. She is otherwise in her usual state of health.  HPI  Past Medical History:  Diagnosis Date  . Bipolar affective disorder, depressed, moderate (HCC) 04/22/2014  . 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 Elevated liver enzymes 04/03/2015  . GERD (gastroesophageal reflux disease)   . Hypercholesterolemia 04/03/2015  . Overweight (BMI 25.0-29.9) 09/11/2015  . 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 ABDOMINAL HYSTERECTOMY     Not due to Cancer  . APPENDECTOMY    . BREAST EXCISIONAL BIOPSY Right 6+yrs ago   neg  . CHOLECYSTECTOMY    . Colocutaneous Fistula Closure    . COLONOSCOPY W/ BIOPSIES  02/02/2013   [facility]- Dr. Doy HutchingMcGreal  . ESOPHAGOGASTRODUODENOSCOPY  11/14/2014   [facility]- Dr. IraqSudan  . GASTRIC BYPASS  09/2008  . PATELLA REALIGNMENT Right     Family History  Problem Relation Age of Onset  . Hyperlipidemia Mother   . Hypertension Mother   . Thyroid disease Mother   . Diabetes Father   . Bipolar  disorder Father   . Hyperlipidemia Father   . Hypertension Father   . Thyroid disease Father   . Congenital heart disease Brother   . Ulcerative colitis Daughter   . Bipolar disorder Paternal Aunt   . Diabetes Paternal Aunt   . Bipolar disorder Paternal Uncle   . Prostate cancer Maternal Grandfather   . Diabetes Maternal Grandfather   . Prostate cancer Paternal Grandfather   . Bipolar disorder Paternal Grandfather   . Anesthesia problems Neg Hx   . Malignant hypertension Neg Hx     Social History Social History  Substance Use Topics  . Smoking status: Never Smoker  . Smokeless tobacco: Never Used  . Alcohol use No    Allergies  Allergen Reactions  . Other Anaphylaxis    Bee stings  . Gluten Meal Other (See Comments)    sensitivity  . Codeine Other (See Comments)    Causes Anger  . Hydrocodone Itching  . Meperidine Itching    Current Outpatient Prescriptions  Medication Sig Dispense Refill  . ARIPiprazole (ABILIFY) 5 MG tablet Take 5 mg by mouth daily.    . cholecalciferol (VITAMIN D) 1000 units tablet Take 1,000 Units by mouth daily.    . ferrous sulfate 325 (65 FE) MG EC tablet Take 325 mg by mouth 3 (three) times daily with meals.    Marland Kitchen. FETZIMA 80 MG CP24 Take 1 capsule by mouth daily.  8  . gabapentin (NEURONTIN) 600 MG tablet Take  600 mg by mouth 3 (three) times daily.  6  . LORazepam (ATIVAN) 0.5 MG tablet Take 1 tablet by mouth 2 (two) times daily as needed.  0  . omeprazole (PRILOSEC) 40 MG capsule Take 1 capsule by mouth 2 (two) times daily.  99  . QUEtiapine (SEROQUEL) 50 MG tablet Take 1-3 tablets by mouth at bedtime as needed.  3  . simvastatin (ZOCOR) 10 MG tablet Take 10 mg by mouth at bedtime.  99  . vitamin A 40981 UNIT capsule Take 10,000 Units by mouth daily.    . Vitamin K, Phytonadione, 100 MCG TABS Take by mouth.     No current facility-administered medications for this visit.      Review of Systems A Multi-point review of systems was asked  and was negative except for the findings documented in the history of present illness  Physical Exam Blood pressure 115/78, pulse (!) 105, temperature 98.5 F (36.9 C), temperature source Oral, height 5\' 10"  (1.778 m), weight 75.4 kg (166 lb 3.2 oz). CONSTITUTIONAL: No acute distress. EYES: Pupils are equal, round, and reactive to light, Sclera are non-icteric. EARS, NOSE, MOUTH AND THROAT: The oropharynx is clear. The oral mucosa is pink and moist. Hearing is intact to voice. LYMPH NODES:  Lymph nodes in the neck are normal. BREAST: Bilateral breast examined. Evidence of previous biopsy seen on the left lateral breast. There are no palpable dominant lesions or masses on either breast or axilla.3 RESPIRATORY:  Lungs are clear. There is normal respiratory effort, with equal breath sounds bilaterally, and without pathologic use of accessory muscles. CARDIOVASCULAR: Heart is regular without murmurs, gallops, or rubs. GI: The abdomen is soft, nontender, and nondistended. There are no palpable masses. There is no hepatosplenomegaly. There are normal bowel sounds in all quadrants. GU: Rectal deferred.   MUSCULOSKELETAL: Normal muscle strength and tone. No cyanosis or edema.   SKIN: Turgor is good and there are no pathologic skin lesions or ulcers. NEUROLOGIC: Motor and sensation is grossly normal. Cranial nerves are grossly intact. PSYCH:  Oriented to person, place and time. Affect is normal.  Data Reviewed Images and labs reviewed. Labs do show an area of increased density to the left breast. Pathology from the biopsy revealed focal dense fibrosis that was negative for proliferative changes, atypia, malignancy. I have personally reviewed the patient's imaging, laboratory findings and medical records.    Assessment    Left breast mass    Plan    51 year old female with a left breast mass seen on imaging with needle biopsy thought to be disconcordant. Given this a surgical excision was  recommended. The procedure of a wire localized lumpectomy was described in detail to include the risks, benefits, alternatives. The possible diagnoses were described to the patient in detail. She voiced understanding and desires to proceed as soon as possible due to an upcoming temporary move to support her daughter. Plan will be to proceed to the operating room on Thursday the 25th for needle localized left-sided biopsy.     Time spent with the patient was 45 minutes, with more than 50% of the time spent in face-to-face education, counseling and care coordination.     Ricarda Frame, MD FACS General Surgeon 05/10/2016, 3:38 PM

## 2016-05-13 NOTE — Op Note (Signed)
   Pre-operative Diagnosis: Left breast mass  Post-operative Diagnosis: Same  Procedure performed: Needle localized left breast lumpectomy  Surgeon: Ricarda Frameharles Willie Plain   Assistants: PA student  Anesthesia: General LMA anesthesia  ASA Class: 1  Surgeon: Ricarda Frameharles Rajinder Mesick, MD FACS  Anesthesia: Gen. with endotracheal tube  Assistant: PA student  Procedure Details  The patient was seen again in the Holding Room. The benefits, complications, treatment options, and expected outcomes were discussed with the patient. The risks of bleeding, infection, recurrence of mass, failure to make diagnosis,  diagnosis of cancer, any of which could require further surgery were reviewed with the patient.   The patient was taken to Operating Room, identified as Brandi Lawson and the procedure verified.  A Time Out was held and the above information confirmed.  Prior to the induction of general anesthesia, antibiotic prophylaxis was administered. VTE prophylaxis was in place. General LMA anesthesia was then administered and tolerated well. After the induction, the left breast was prepped with Chloraprep and draped in the sterile fashion. The patient was positioned in the supine position.   The patient had a radiology guided wire placed before coming to the operating room and was prepped into our field. Using the provided mammogram images and ultrasounding of the clip placement a incision was planned halfway between the nipple areolar complex and the extreme lateral placed wire. The skin was localized with a 50-50 mixture of 1% lidocaine 0.5% Marcaine plain and incised with 15 blade scalpel. Using Bovie electrocautery was taken down to the subcutaneous fat and then tunneled laterally to the wire that was pulled into our incision site.  Following the wire a circumferential conical-shaped excision was planned and carried out along the wire for its entire length. The distal tip of the wire was identified during the  dissection and the tissue was incorporated in the excision. Approximately 5 x 2 x 2 cm of tissue was removed. The tissue was removed without difficulty and sent to radiology for mammogram. Under mammogram the clip, wire, dense tissue was confirmed within the specimen.  The excision cavity was then copiously irrigated with sterile water and meticulous hemostasis was ensured. The deep dermal tissue was reapproximated with an interrupted 3-0 Vicryl. The subcutaneous tissue was closed with a 4-0 Monocryl. The skin was then sealed Dermabond.  Patient tolerated the procedure well, there were no immediate complications, all counts were correct at the end of the procedure. She was awoken from general anesthesia and transferred to the PACU in good condition.  Findings: Left breast mass   Estimated Blood Loss: 5 mL         Drains: None         Specimens: Left breast mass          Complications: None                  Condition: Good   Ricarda Frameharles Nesa Distel, MD, FACS

## 2016-05-13 NOTE — Anesthesia Post-op Follow-up Note (Cosign Needed)
Anesthesia QCDR form completed.        

## 2016-05-14 LAB — SURGICAL PATHOLOGY

## 2016-05-17 ENCOUNTER — Telehealth: Payer: Self-pay | Admitting: General Surgery

## 2016-05-17 NOTE — Telephone Encounter (Signed)
Patient has an appointment Thursday but want to know if her results are back yet.

## 2016-05-17 NOTE — Telephone Encounter (Signed)
Returned phone call to patient. Explained to her that pathology results are back and are under review by physician at this time. Also told her that this result is normally reviewed at post-op visit but I will contact physician to find out if pathology results can be given over the phone. If so, I will get back in touch with the patient tomorrow.

## 2016-05-18 NOTE — Telephone Encounter (Signed)
Notified by Dr. Tonita CongWoodham that results of biopsy are benign. Call made to patient. Results given to her. She is very happy with this news. Confirmed patient's appointment time and location on Thursday with Dr. Tonita CongWoodham

## 2016-05-20 ENCOUNTER — Ambulatory Visit (INDEPENDENT_AMBULATORY_CARE_PROVIDER_SITE_OTHER): Payer: Medicare Other | Admitting: General Surgery

## 2016-05-20 ENCOUNTER — Encounter: Payer: Self-pay | Admitting: General Surgery

## 2016-05-20 VITALS — BP 133/83 | HR 106 | Temp 98.5°F | Ht 67.0 in | Wt 166.0 lb

## 2016-05-20 DIAGNOSIS — Z4889 Encounter for other specified surgical aftercare: Secondary | ICD-10-CM

## 2016-05-20 NOTE — Progress Notes (Signed)
Outpatient Surgical Follow Up  05/20/2016  Brandi Lawson is an 51 y.o. female.   Chief Complaint  Patient presents with  . Routine Post Op    Left breast Lumpectomy (05/13/16)- Dr. Tonita CongWoodham    HPI: 51 year old female returns to clinic 1 week status post left breast needle localized lumpectomy. Patient reports doing very well. She is having itching and discomfort underneath the Dermabond only. She's not required any pain medications. She otherwise denies any fevers, chills, nausea, vomiting, chest pain, shortness breath, diarrhea, constipation.  Past Medical History:  Diagnosis Date  . Bipolar affective disorder, depressed, moderate (HCC) 04/22/2014  . 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 Elevated liver enzymes 04/03/2015  . GERD (gastroesophageal reflux disease)   . Hypercholesterolemia 04/03/2015  . Overweight (BMI 25.0-29.9) 09/11/2015  . 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 ABDOMINAL HYSTERECTOMY     Not due to Cancer  . APPENDECTOMY    . BREAST EXCISIONAL BIOPSY Right 6+yrs ago   neg  . BREAST LUMPECTOMY WITH NEEDLE LOCALIZATION Left 05/13/2016   Procedure: BREAST LUMPECTOMY WITH NEEDLE LOCALIZATION;  Surgeon: Ricarda Frameharles Exzavier Ruderman, MD;  Location: [facility] ORS;  Service: General;  Laterality: Left;  . CHOLECYSTECTOMY    . Colocutaneous Fistula Closure    . COLONOSCOPY W/ BIOPSIES  02/02/2013   [facility]- Dr. Doy HutchingMcGreal  . ESOPHAGOGASTRODUODENOSCOPY  11/14/2014   [facility]- Dr. IraqSudan  . GASTRIC BYPASS  09/2008  . HERNIA REPAIR    . PATELLA REALIGNMENT Right     Family History  Problem Relation Age of Onset  . Hyperlipidemia Mother   . Hypertension Mother   . Thyroid disease Mother   . Diabetes Father   . Bipolar disorder Father   . Hyperlipidemia Father   . Hypertension Father   . Thyroid disease Father   . Congenital heart disease Brother   . Ulcerative colitis Daughter   . Bipolar disorder  Paternal Aunt   . Diabetes Paternal Aunt   . Bipolar disorder Paternal Uncle   . Prostate cancer Maternal Grandfather   . Diabetes Maternal Grandfather   . Prostate cancer Paternal Grandfather   . Bipolar disorder Paternal Grandfather   . Anesthesia problems Neg Hx   . Malignant hypertension Neg Hx     Social History:  reports that she has never smoked. She has never used smokeless tobacco. She reports that she does not drink alcohol or use drugs.  Allergies:  Allergies  Allergen Reactions  . Other Anaphylaxis    Bee stings  . Codeine Other (See Comments)    Causes Anger  . Hydrocodone Itching  . Meperidine Itching    Medications reviewed.    ROS A multipoint review of systems was completed. All pertinent positives and negatives are documented within the history of present illness the remainder were negative.   BP 133/83   Pulse (!) 106   Temp 98.5 F (36.9 C) (Oral)   Ht 5\' 7"  (1.702 m)   Wt 75.3 kg (166 lb)   BMI 26.00 kg/m   Physical Exam Gen.: No acute distress Left breast examined, Dermabond in place with some mild hyperemia underneath the Dermabond. There is no spreading erythema or drainage. The incision site is well approximated underneath the Dermabond Chest: Clear to auscultation Heart: Regular rhythm Abdomen: Soft, nontender, nondistended    No results found for this or any previous visit (from the past 48 hour(s)). No results found.  Assessment/Plan:  1. Aftercare following surgery 51 year old female one week status post left breast lumpectomy. Pathology reviewed which showed a benign result. Discussed she appears to be having a reaction to the Dermabond and we will document the Dermabond allergy on her chart. All questions answered to the patient's satisfaction. Discussed the importance of repeat imaging in 6 months. She will call when she returns from helping her daughter in [place] to schedule her mammogram.     Ricarda Frame, MD  [facility] General Surgeon  05/20/2016,9:42 AM

## 2016-05-20 NOTE — Patient Instructions (Signed)
You will need to see us back for a follow-up in the office after having a Mammogram. We will call you when it is time for this appointment.  Please call our office with any questions or concerns.  Please do not submerge in a tub, hot tub, or pool until incisions are completely sealed.  Use sun block to incision area over the next year if this area will be exposed to sun. This helps decrease scarring.  You may resume your normal activities on 06/24/16. At that time- Listen to your body when lifting, if you have pain when lifting, stop and then try again in a few days. Pain after doing exercises or activities of daily living is normal as you get back in to your normal routine.  If you develop redness, drainage, or pain at incision sites- call our office immediately and speak with a nurse.

## 2016-10-19 ENCOUNTER — Telehealth: Payer: Self-pay

## 2016-10-19 DIAGNOSIS — Z1231 Encounter for screening mammogram for malignant neoplasm of breast: Secondary | ICD-10-CM

## 2016-10-19 NOTE — Telephone Encounter (Signed)
Patient in need of 6 month follow-up with Dr. Tonita CongWoodham with Mammogram prior. Orders placed. [facility] Melissa. Mammogram and Ultrasound scheduled for 11/22/16 at 1020am. Follow-up appointment with Dr. Tonita CongWoodham scheduled for 11/24/16 at 0945am.  Call made to patient. All appointment information given.

## 2016-11-04 DIAGNOSIS — Z9889 Other specified postprocedural states: Secondary | ICD-10-CM | POA: Insufficient documentation

## 2016-11-22 ENCOUNTER — Other Ambulatory Visit: Payer: Medicare Other

## 2016-11-23 ENCOUNTER — Other Ambulatory Visit: Payer: Self-pay | Admitting: Family Medicine

## 2016-11-23 DIAGNOSIS — R7989 Other specified abnormal findings of blood chemistry: Secondary | ICD-10-CM

## 2016-11-23 DIAGNOSIS — R945 Abnormal results of liver function studies: Principal | ICD-10-CM

## 2016-11-24 ENCOUNTER — Ambulatory Visit: Payer: Self-pay | Admitting: General Surgery

## 2016-11-30 ENCOUNTER — Ambulatory Visit
Admission: RE | Admit: 2016-11-30 | Discharge: 2016-11-30 | Disposition: A | Discharge status: Home / Self Care | Payer: Medicare Other | Source: Ambulatory Visit | Attending: Family Medicine | Admitting: Family Medicine

## 2016-11-30 DIAGNOSIS — R7989 Other specified abnormal findings of blood chemistry: Secondary | ICD-10-CM

## 2016-11-30 DIAGNOSIS — R945 Abnormal results of liver function studies: Secondary | ICD-10-CM

## 2016-11-30 DIAGNOSIS — K769 Liver disease, unspecified: Secondary | ICD-10-CM | POA: Insufficient documentation

## 2016-12-01 ENCOUNTER — Ambulatory Visit: Payer: Medicare Other

## 2016-12-06 ENCOUNTER — Ambulatory Visit
Admission: RE | Admit: 2016-12-06 | Discharge: 2016-12-06 | Disposition: A | Discharge status: Home / Self Care | Payer: Medicare Other | Source: Ambulatory Visit | Attending: General Surgery | Admitting: General Surgery

## 2016-12-06 DIAGNOSIS — Z1231 Encounter for screening mammogram for malignant neoplasm of breast: Secondary | ICD-10-CM | POA: Diagnosis not present

## 2016-12-15 ENCOUNTER — Ambulatory Visit: Payer: Self-pay | Admitting: General Surgery

## 2016-12-15 ENCOUNTER — Telehealth: Payer: Self-pay

## 2016-12-15 NOTE — Telephone Encounter (Signed)
Call made to patient at this time. No answer. Left voicemail for return phone call.  If patient returns phone call, she needs a follow-up scheduled with Dr. Tonita CongWoodham for her Mammogram. She is 7 months post-op Lumpectomy (05/13/16)- Dr. Tonita CongWoodham. This would be her 6 month follow-up from surgery.

## 2016-12-16 NOTE — Telephone Encounter (Signed)
Call made to patient once again. She was having difficulty understanding why she would need to come back in after speaking with [facility]. I explained to the patient about how they are followed by surgeon for extended period of time after breast surgery and that this particular visit would be so that the surgeon can look at mammogram images, check on previous surgery site, and to do a thorough breast exam. She verbalizes understanding and wishes to reschedule her appointment.  She did state that she is currently out of town for a family funeral. Scheduled to see Dr. Tonita CongWoodham on 12/29/16.

## 2016-12-22 ENCOUNTER — Telehealth: Payer: Self-pay | Admitting: General Practice

## 2016-12-22 NOTE — Telephone Encounter (Signed)
Noted! Thank you

## 2016-12-22 NOTE — Telephone Encounter (Signed)
Patient is calling to let us know she wants to keep her appointment on 12/29/16 with Dr. Vianne BullsWoodham.for a follow up mammogram.

## 2016-12-24 ENCOUNTER — Other Ambulatory Visit: Payer: Self-pay

## 2016-12-29 ENCOUNTER — Encounter: Payer: Self-pay | Admitting: General Surgery

## 2016-12-29 ENCOUNTER — Ambulatory Visit (INDEPENDENT_AMBULATORY_CARE_PROVIDER_SITE_OTHER): Payer: Medicare Other | Admitting: General Surgery

## 2016-12-29 VITALS — BP 120/81 | HR 87 | Temp 98.2°F | Ht 67.0 in | Wt 173.6 lb

## 2016-12-29 DIAGNOSIS — Z1231 Encounter for screening mammogram for malignant neoplasm of breast: Secondary | ICD-10-CM

## 2016-12-29 DIAGNOSIS — Z9889 Other specified postprocedural states: Secondary | ICD-10-CM | POA: Diagnosis not present

## 2016-12-29 NOTE — Progress Notes (Signed)
Outpatient Surgical Follow Up  12/29/2016  Brandi Sherald BargeLynne Carbary is an 51 y.o. female.   Chief Complaint  Patient presents with  . Follow-up    6 Month- Left Lumpectomy (05/13/16)- Dr. Tonita CongWoodham    HPI: 51 year old female returns to clinic now 7 months status post left breast lumpectomy for what turned out to be benign disease. She reports she is doing well. She has completed her 6 month follow-up mammogram last month without concerning finding. She is performing self breast exams without any new findings of lesions, lumps, masses. She states she is otherwise in her usual state of health and denies any current fevers, chills, nausea, vomiting, chest pain, shortness of breath, diarrhea, constipation.  Past Medical History:  Diagnosis Date  . Bipolar affective disorder, depressed, moderate (HCC) 04/22/2014  . 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 Elevated liver enzymes 04/03/2015  . GERD (gastroesophageal reflux disease)   . Hypercholesterolemia 04/03/2015  . Overweight (BMI 25.0-29.9) 09/11/2015  . 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 ABDOMINAL HYSTERECTOMY     Not due to Cancer  . APPENDECTOMY    . BREAST BIOPSY Left 05/05/2016   Focal Dense Fibrosis/Fibrocystic Change per rad and path report  . BREAST EXCISIONAL BIOPSY Right 6+yrs ago   neg  . BREAST EXCISIONAL BIOPSY Left 04/2016   Fibrocystic change(excisional bx after core bx)  . BREAST LUMPECTOMY WITH NEEDLE LOCALIZATION Left 05/13/2016   Procedure: BREAST LUMPECTOMY WITH NEEDLE LOCALIZATION;  Surgeon: Ricarda Frameharles Damonie Furney, MD;  Location: [facility] ORS;  Service: General;  Laterality: Left;  . CHOLECYSTECTOMY    . Colocutaneous Fistula Closure    . COLONOSCOPY W/ BIOPSIES  02/02/2013   [facility]- Dr. Doy HutchingMcGreal  . ESOPHAGOGASTRODUODENOSCOPY  11/14/2014   [facility]- Dr. [place]  . GASTRIC BYPASS  09/2008  . HERNIA REPAIR    . PATELLA REALIGNMENT Right     Family  History  Problem Relation Age of Onset  . Hyperlipidemia Mother   . Hypertension Mother   . Thyroid disease Mother   . Diabetes Father   . Bipolar disorder Father   . Hyperlipidemia Father   . Hypertension Father   . Thyroid disease Father   . Congenital heart disease Brother   . Ulcerative colitis Daughter   . Bipolar disorder Paternal Aunt   . Diabetes Paternal Aunt   . Bipolar disorder Paternal Uncle   . Prostate cancer Maternal Grandfather   . Diabetes Maternal Grandfather   . Prostate cancer Paternal Grandfather   . Bipolar disorder Paternal Grandfather   . Anesthesia problems Neg Hx   . Malignant hypertension Neg Hx   . Breast cancer Neg Hx     Social History:  reports that she has never smoked. She has never used smokeless tobacco. She reports that she does not drink alcohol or use drugs.  Allergies:  Allergies  Allergen Reactions  . Other Anaphylaxis    Bee stings  . Codeine Other (See Comments)    Causes Anger  . Hydrocodone Itching  . Meperidine Itching    Medications reviewed.    ROS A multipoint review of systems was completed, all pertinent positives and negatives are documented within the history of present illness the remainder are negative   BP 120/81   Pulse 87   Temp 98.2 F (36.8 C) (Oral)   Ht 5\' 7"  (1.702 m)   Wt 78.7 kg (173 lb 9.6 oz)   BMI 27.19 kg/m  Physical Exam Gen.: No acute distress Neck: Supple and nontender Breasts: Bilateral breasts examined. Left breast with a linear scar to the lateral aspect of the left breast. It is well-healed without any palpable concerning finding. No unusual lumps or masses palpated in the breast or axilla malaise. Right breast with a 1 cm palpable cystic area at the 9:00 position that is freely mobile and superficial. No other significant finding. Chest: Clear to auscultation Heart: Regular rate and rhythm Abdomen: Soft and nontender Skin: Bilateral upper extremities with soft tissue masses that  are rubbery and freely mobile consistent with lipomas.   No results found for this or any previous visit (from the past 48 hour(s)). No results found.  Assessment/Plan:  1. Screening mammogram, encounter for Patient recommended to have her annual screening mammogram performed in December per radiology. This was ordered today in clinic. - MM SCREENING BREAST TOMO BILATERAL; Future  2. History of breast biopsy Patient with a history of a fibrocystic change excision earlier this year. Discussed the currently palpable feeling of a cyst in the right breast. Should the area change in size patient is to return to clinic immediately for repeat evaluation. Otherwise she will follow-up in clinic for repeat exam in January after she completes her screening mammogram. All questions answered to patient's satisfaction.  A total of 15 minutes was used on this encounter with greater than 50% of it used for counseling and coordination of care.   Ricarda Frame, MD [facility] General Surgeon  12/29/2016,11:47 AM

## 2016-12-29 NOTE — Patient Instructions (Signed)
We will see you back in 3 months for your mammogram and follow-up appointment with Dr. Tonita CongWoodham.  Please continue to do your monthly self exams and if you find a new lump or any concerns at all, call our office immediately.   Breast Self-Awareness Introduction Breast self-awareness means:  Knowing how your breasts look.  Knowing how your breasts feel.  Checking your breasts every month for changes.  Telling your doctor if you notice a change in your breasts. Breast self-awareness allows you to notice a breast problem early while it is still small. How to do a breast self-exam One way to learn what is normal for your breasts and to check for changes is to do a breast self-exam. To do a breast self-exam: Look for Changes  1. Take off all the clothes above your waist. 2. Stand in front of a mirror in a room with good lighting. 3. Put your hands on your hips. 4. Push your hands down. 5. Look at your breasts and nipples in the mirror to see if one breast or nipple looks different than the other. Check to see if:  The shape of one breast is different.  The size of one breast is different.  There are wrinkles, dips, and bumps in one breast and not the other. 6. Look at each breast for changes in your skin, such as:  Redness.  Scaly areas. 7. Look for changes in your nipples, such as:  Liquid around the nipples.  Bleeding.  Dimpling.  Redness.  A change in where the nipples are. Feel for Changes 1. Lie on your back on the floor. 2. Feel each breast. To do this, follow these steps:  Pick a breast to feel.  Put the arm closest to that breast above your head.  Use your other arm to feel the nipple area of your breast. Feel the area with the pads of your three middle fingers by making small circles with your fingers. For the first circle, press lightly. For the second circle, press harder. For the third circle, press even harder.  Keep making circles with your fingers at  the light, harder, and even harder pressures as you move down your breast. Stop when you feel your ribs.  Move your fingers a little toward the center of your body.  Start making circles with your fingers again, this time going up until you reach your collarbone.  Keep making up and down circles until you reach your armpit. Remember to keep using the three pressures.  Feel the other breast in the same way. 3. Sit or stand in the shower or tub. 4. With soapy water on your skin, feel each breast the same way you did in step 2, when you were lying on the floor. Write Down What You Find  After doing the self-exam, write down:  What is normal for each breast.  Any changes you find in each breast.  When you last had your period. How often should I check my breasts? Check your breasts every month. If you are breastfeeding, the best time to check them is after you feed your baby or after you use a breast pump. If you get periods, the best time to check your breasts is 5-7 days after your period is over. When should I see my doctor? See your doctor if you notice:  A change in shape or size of your breasts or nipples.  A change in the skin of your breast or nipples, such as  red or scaly skin.  Unusual fluid coming from your nipples.  A lump or thick area that was not there before.  Pain in your breasts.  Anything that concerns you. This information is not intended to replace advice given to you by your health care provider. Make sure you discuss any questions you have with your health care provider. Document Released: 09/22/2007 Document Revised: 09/11/2015 Document Reviewed: 02/23/2015  2017 Elsevier

## 2017-04-06 ENCOUNTER — Other Ambulatory Visit: Payer: Self-pay | Admitting: General Surgery

## 2017-04-06 ENCOUNTER — Ambulatory Visit
Admission: RE | Admit: 2017-04-06 | Discharge: 2017-04-06 | Disposition: A | Discharge status: Home / Self Care | Payer: Medicare Other | Source: Ambulatory Visit | Attending: General Surgery | Admitting: General Surgery

## 2017-04-06 DIAGNOSIS — Z1231 Encounter for screening mammogram for malignant neoplasm of breast: Secondary | ICD-10-CM

## 2017-04-07 ENCOUNTER — Telehealth: Payer: Self-pay

## 2017-04-07 ENCOUNTER — Other Ambulatory Visit: Payer: Self-pay

## 2017-04-07 NOTE — Telephone Encounter (Signed)
Patient was called by Nicholaus BloomKelley for a reminder appointment with Dr. Hoy FinlayWoodman on 12/21. However patient stated that she did not have her mammogram completed due to the order needing to be changed to diagnosed. She was advised of this order change and was told by the nurse that examined her that they would contact our office with the order change. However we were not contacted about this order. I advised patient that I will place the new order and get it scheduled for her and have her to follow up with us in office in January.

## 2017-04-08 ENCOUNTER — Ambulatory Visit: Payer: Self-pay | Admitting: General Surgery

## 2017-04-08 ENCOUNTER — Other Ambulatory Visit: Payer: Self-pay | Admitting: General Surgery

## 2017-04-08 DIAGNOSIS — N6310 Unspecified lump in the right breast, unspecified quadrant: Secondary | ICD-10-CM

## 2017-04-08 NOTE — Telephone Encounter (Signed)
Returned call to [facility]. I spoke with Samatha and she stated that the patient presented to the technologist on Wednesday that she had discovered a new area on her right breast. With this new finding the current order needed to be changed to diagnostic and Samatha verbalized to me that she would get this order changed and contact the patient however Dr. Tonita CongWoodham would need to sign off on this order. I verbalized understanding and thanked her for her time.

## 2017-04-08 NOTE — Telephone Encounter (Signed)
Called made to [facility] at this time. Left a message for someone to call back to verify that the patient needs a new order placed based on new findings of her right breast as she stated to me yesterday.

## 2017-04-22 ENCOUNTER — Telehealth: Payer: Self-pay

## 2017-04-22 NOTE — Telephone Encounter (Addendum)
Call made to patient at this time. I verbalized to her that I wanted to schedule a follow up appointment for her with Dr. Tonita CongWoodham to review her mammograms. She verbalized that she would be out of time and would only be available for 1/11 and would out of town until the end of the month. I offered her an appointment for 1/11 and she accepted and verbalized understanding.

## 2017-04-28 ENCOUNTER — Ambulatory Visit
Admission: RE | Admit: 2017-04-28 | Discharge: 2017-04-28 | Disposition: A | Discharge status: Home / Self Care | Payer: Medicare Other | Source: Ambulatory Visit | Attending: General Surgery | Admitting: General Surgery

## 2017-04-28 DIAGNOSIS — Z9889 Other specified postprocedural states: Secondary | ICD-10-CM | POA: Diagnosis not present

## 2017-04-28 DIAGNOSIS — N6310 Unspecified lump in the right breast, unspecified quadrant: Secondary | ICD-10-CM

## 2017-04-28 DIAGNOSIS — R928 Other abnormal and inconclusive findings on diagnostic imaging of breast: Secondary | ICD-10-CM | POA: Insufficient documentation

## 2017-04-29 ENCOUNTER — Encounter: Payer: Self-pay | Admitting: General Surgery

## 2017-04-29 ENCOUNTER — Ambulatory Visit: Payer: Medicare Other | Admitting: General Surgery

## 2017-04-29 VITALS — BP 128/79 | HR 83 | Temp 98.3°F | Ht 67.0 in | Wt 173.0 lb

## 2017-04-29 DIAGNOSIS — Z9889 Other specified postprocedural states: Secondary | ICD-10-CM | POA: Diagnosis not present

## 2017-04-29 NOTE — Progress Notes (Signed)
Outpatient Surgical Follow Up  04/29/2017  Brandi Lawson is an 52 y.o. female.   Chief Complaint  Patient presents with  . Follow-up    Mammogram Results Patient will be out of town from 15-26th    HPI: 52 year old female returns to clinic for follow-up of her recent breast imaging.  She is approximately 1 year status post left breast biopsy for what turned out to be a benign breast mass.  She reported having felt something on the right side prior to the images and numerous images were obtained.  No new finding since then.  She denies any fevers, chills, nausea, vomiting, chest pain, shortness of breath, diarrhea, constipation.  She is otherwise in her usual state of excellent health.  Past Medical History:  Diagnosis Date  . Bipolar affective disorder, depressed, moderate (HCC) 04/22/2014  . 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 Elevated liver enzymes 04/03/2015  . GERD (gastroesophageal reflux disease)   . Hypercholesterolemia 04/03/2015  . Overweight (BMI 25.0-29.9) 09/11/2015  . 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 ABDOMINAL HYSTERECTOMY     Not due to Cancer  . APPENDECTOMY    . BREAST BIOPSY Left 05/05/2016   Focal Dense Fibrosis/Fibrocystic Change per rad and path report  . BREAST EXCISIONAL BIOPSY Right 6+yrs ago   neg  . BREAST EXCISIONAL BIOPSY Left 04/2016   Fibrocystic change(excisional bx after core bx)  . BREAST LUMPECTOMY WITH NEEDLE LOCALIZATION Left 05/13/2016   Procedure: BREAST LUMPECTOMY WITH NEEDLE LOCALIZATION;  Surgeon: Ricarda Frame, MD;  Location: [facility] ORS;  Service: General;  Laterality: Left;  . CHOLECYSTECTOMY    . Colocutaneous Fistula Closure    . COLONOSCOPY W/ BIOPSIES  02/02/2013   [facility]- Dr. Doy Hutching  . ESOPHAGOGASTRODUODENOSCOPY  11/14/2014   [facility]- Dr. Iraq  . GASTRIC BYPASS  09/2008  . HERNIA REPAIR    . PATELLA REALIGNMENT Right     Family  History  Problem Relation Age of Onset  . Hyperlipidemia Mother   . Hypertension Mother   . Thyroid disease Mother   . Diabetes Father   . Bipolar disorder Father   . Hyperlipidemia Father   . Hypertension Father   . Thyroid disease Father   . Congenital heart disease Brother   . Ulcerative colitis Daughter   . Bipolar disorder Paternal Aunt   . Diabetes Paternal Aunt   . Bipolar disorder Paternal Uncle   . Prostate cancer Maternal Grandfather   . Diabetes Maternal Grandfather   . Prostate cancer Paternal Grandfather   . Bipolar disorder Paternal Grandfather   . Anesthesia problems Neg Hx   . Malignant hypertension Neg Hx   . Breast cancer Neg Hx     Social History:  reports that  has never smoked. she has never used smokeless tobacco. She reports that she does not drink alcohol or use drugs.  Allergies:  Allergies  Allergen Reactions  . Other Anaphylaxis    Bee stings  . Zolpidem Other (See Comments)    hallucinations hallucinations   . Codeine Other (See Comments)    Causes Anger  . Hydrocodone Itching  . Meperidine Itching    Medications reviewed.    ROS A multipoint review of systems was completed, all pertinent positives and negatives are documented within the HPI and the remainder are negative   BP 128/79   Pulse 83   Temp 98.3 F (36.8 C) (Oral)   Ht 5'  7" (1.702 m)   Wt 78.5 kg (173 lb)   BMI 27.10 kg/m   Physical Exam  General: No acute distress Neck: Supple nontender Breast: Bilateral breast examined.  Well-healed left breast biopsy site.  No palpable areas of concern to either breast or axilla on exam today. Chest: Clear to auscultation Heart: Regular rate and rhythm Abdomen: Soft and nontender   No results found for this or any previous visit (from the past 48 hour(s)). [employer] Axilla  Result Date: 04/28/2017 CLINICAL DATA:  52 year old patient due for annual examination. Possible palpable area of concern in the  outer right breast. The patient has a history of bilateral breast excisional biopsies with benign results. EXAM: 2D DIGITAL DIAGNOSTIC BILATERAL MAMMOGRAM WITH CAD AND ADJUNCT TOMO ULTRASOUND RIGHT BREAST COMPARISON:  Previous exam(s). ACR Breast Density Category b: There are scattered areas of fibroglandular density. FINDINGS: Postsurgical changes are seen in the outer breasts bilaterally. No mass, nonsurgical distortion, or suspicious microcalcification is identified in either breast to suggest malignancy. Spot tangential view of the region of patient concern in the outer right breast is negative. Mammographic images were processed with CAD. On physical exam, there is a subtle healed surgical scar in the 10:00 position of the right breast. No mass is palpated in the outer right breast in the region of patient concern, in the 9-10 o'clock region. Targeted ultrasound is performed, showing normal predominately fatty breast parenchyma in the outer right breast. No solid or cystic mass or abnormal shadowing is identified to suggest malignancy. IMPRESSION: No evidence of malignancy in either breast. RECOMMENDATION: Screening mammogram in one year.(Code:SM-B-01Y) I have discussed the findings and recommendations with the patient. Results were also provided in writing at the conclusion of the visit. If applicable, a reminder letter will be sent to the patient regarding the next appointment. BI-RADS CATEGORY  1: Negative. Electronically Signed   By: Britta MccreedySusan  Turner M.D.   On: 04/28/2017 14:00   Mm Diag Breast Tomo Bilateral  Result Date: 04/28/2017 CLINICAL DATA:  52 year old patient due for annual examination. Possible palpable area of concern in the outer right breast. The patient has a history of bilateral breast excisional biopsies with benign results. EXAM: 2D DIGITAL DIAGNOSTIC BILATERAL MAMMOGRAM WITH CAD AND ADJUNCT TOMO ULTRASOUND RIGHT BREAST COMPARISON:  Previous exam(s). ACR Breast Density Category b: There  are scattered areas of fibroglandular density. FINDINGS: Postsurgical changes are seen in the outer breasts bilaterally. No mass, nonsurgical distortion, or suspicious microcalcification is identified in either breast to suggest malignancy. Spot tangential view of the region of patient concern in the outer right breast is negative. Mammographic images were processed with CAD. On physical exam, there is a subtle healed surgical scar in the 10:00 position of the right breast. No mass is palpated in the outer right breast in the region of patient concern, in the 9-10 o'clock region. Targeted ultrasound is performed, showing normal predominately fatty breast parenchyma in the outer right breast. No solid or cystic mass or abnormal shadowing is identified to suggest malignancy. IMPRESSION: No evidence of malignancy in either breast. RECOMMENDATION: Screening mammogram in one year.(Code:SM-B-01Y) I have discussed the findings and recommendations with the patient. Results were also provided in writing at the conclusion of the visit. If applicable, a reminder letter will be sent to the patient regarding the next appointment. BI-RADS CATEGORY  1: Negative. Electronically Signed   By: Britta MccreedySusan  Turner M.D.   On: 04/28/2017 14:00    Assessment/Plan:  1.  History of breast biopsy 52 year old female here for an annual breast exam after imaging.  Images showed no concerning findings.  Physical exam showed no concerning findings.  Discussed the importance of performing self breast exams and annual clinical breast exams.  Discussed this could be performed by a surgeon or primary care provider.  Patient voiced understanding and will follow-up on an as-needed basis.     Ricarda Frame, MD FACS General Surgeon  04/29/2017,12:33 PM

## 2017-04-29 NOTE — Patient Instructions (Addendum)
Please give us a call in case you have any questions or concerns.  Please follow up with your primary care doctor with your yearly mammograms.

## 2017-05-05 ENCOUNTER — Ambulatory Visit: Payer: Self-pay | Admitting: General Surgery

## 2017-12-20 IMAGING — MG NEEDLE LOCALIZATION OF THE LEFT BREAST WITH MAMMO GUIDANCE
8 of 12 series · 8 of 32 positions shown · non-contrast
Comparison: Previous exams.

CLINICAL DATA: Preoperative needle localization, prior to left
breast excisional biopsy.

EXAM:
NEEDLE LOCALIZATION OF THE LEFT BREAST WITH MAMMO GUIDANCE

[L CC (1 of 3)]
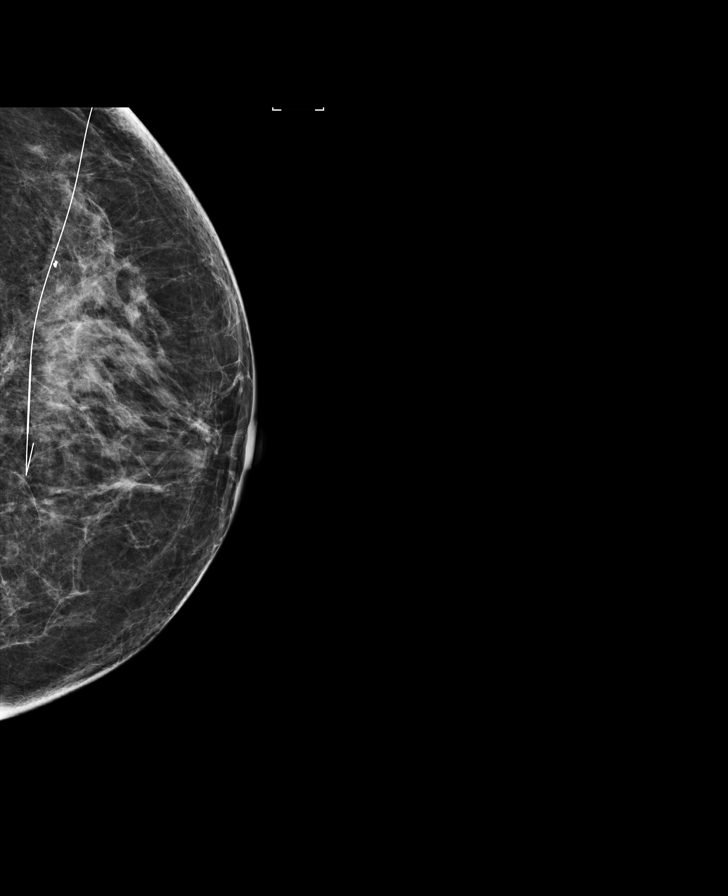

[L CC (2 of 3)]
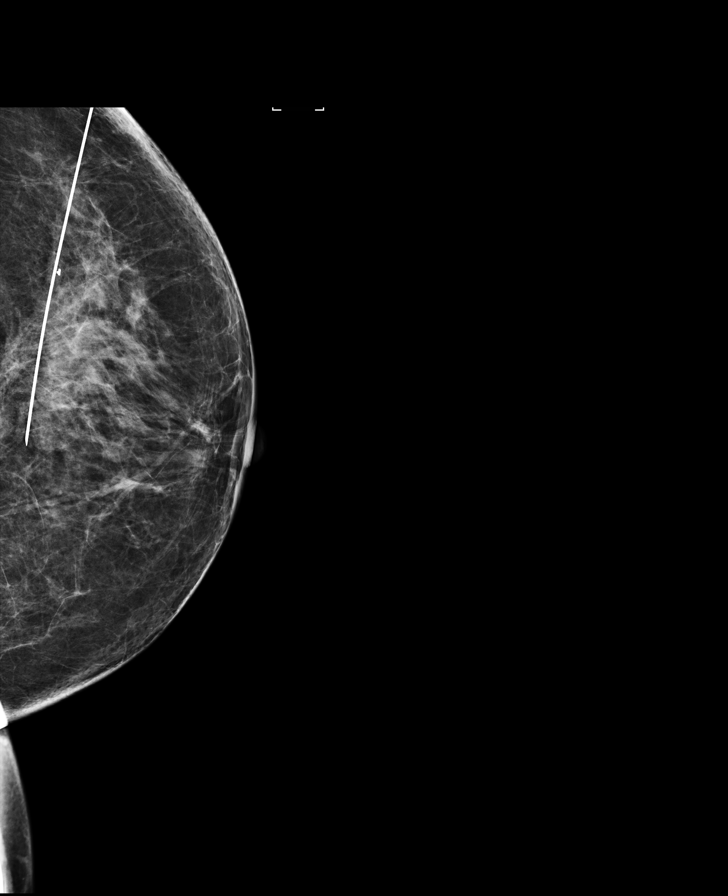

[L LM synth-2D (1 of 2)]
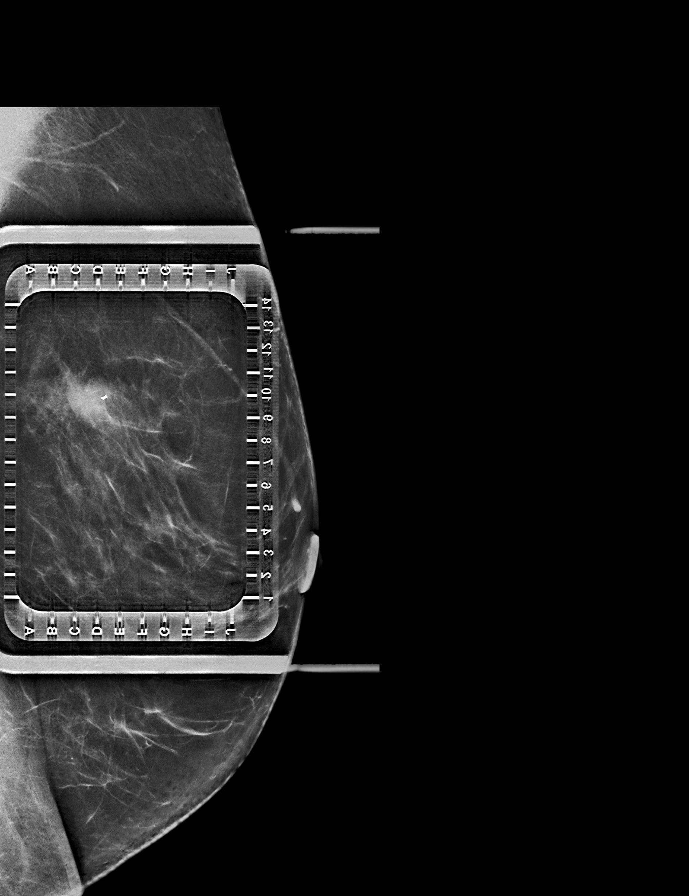

[L LM synth-2D (2 of 2)]
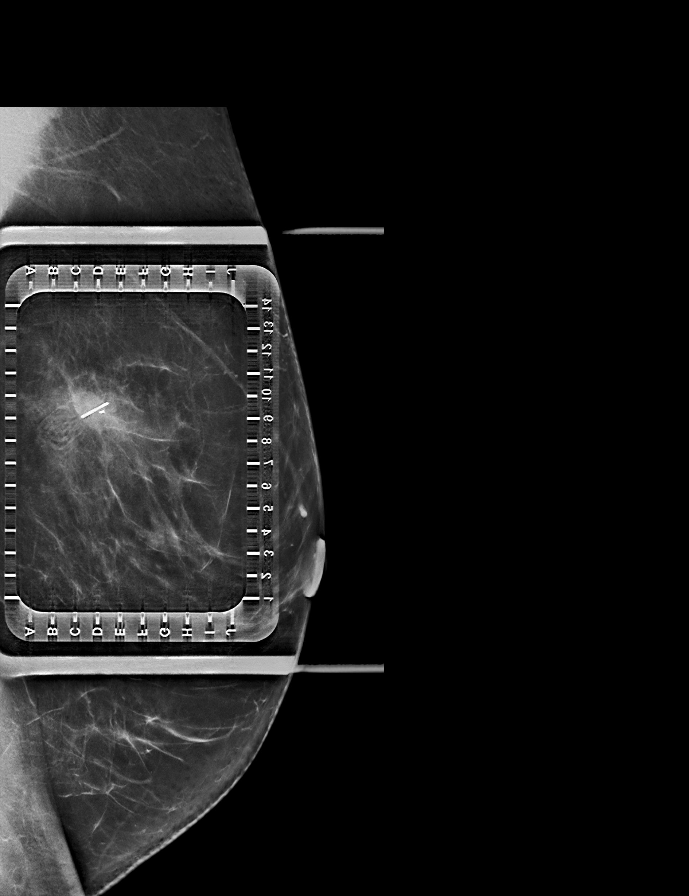

[L CC (3 of 3)]
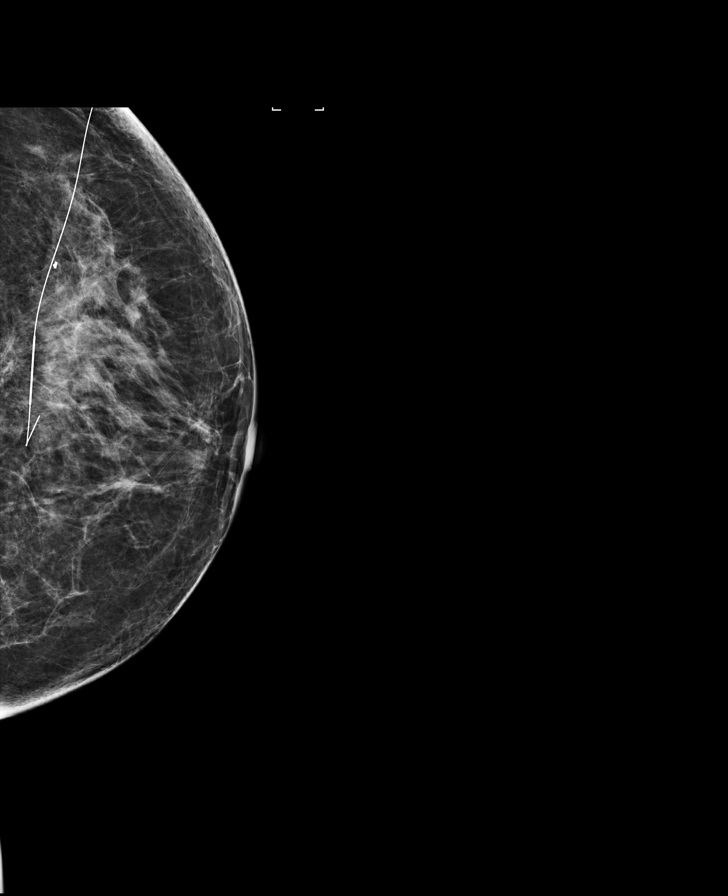

[L LM (1 of 2)]
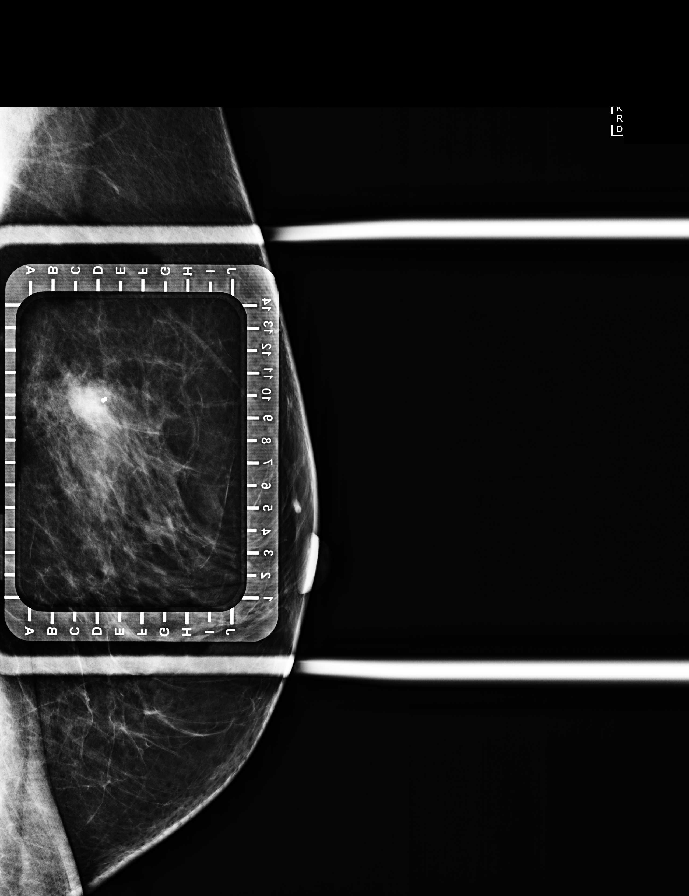

[L LM (2 of 2)]
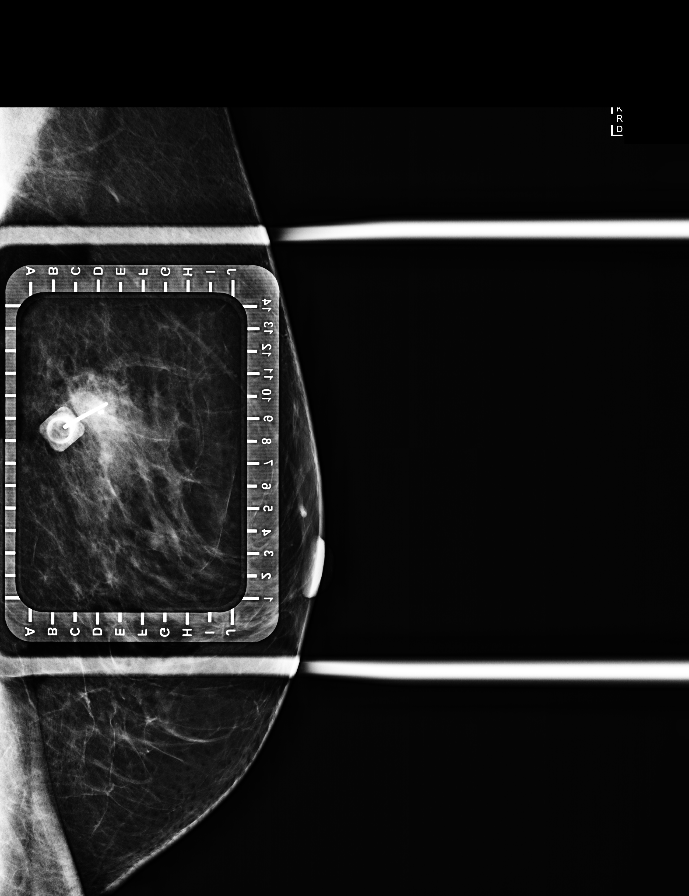

[L CC tomo · tomo slice 32/63.0]
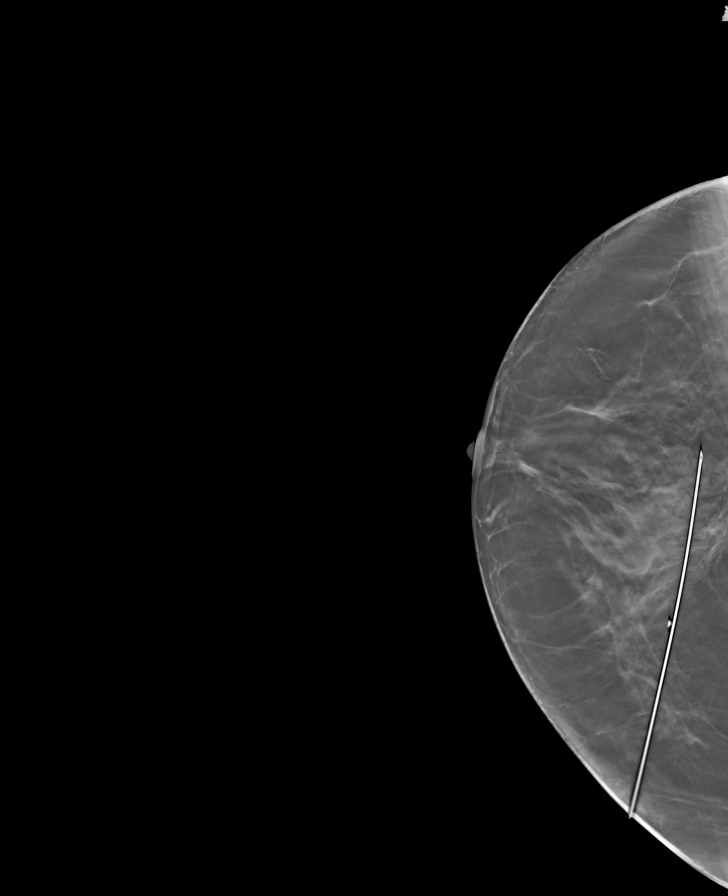

[8 of 32 positions shown; findings below may reference images not displayed]

FINDINGS: Patient presents for needle localization prior to left breast
excisional biopsy. I met with the patient and we discussed the
procedure of needle localization including benefits and
alternatives. We discussed the high likelihood of a successful
procedure. We discussed the risks of the procedure, including
infection, bleeding, tissue injury, and further surgery. Informed,
written consent was given. The usual time-out protocol was performed
immediately prior to the procedure.

Using mammographic guidance, sterile technique, 1% lidocaine and a
11 cm modified Kopans needle, left breast upper outer quadrant area
of distortion was localized using lateral approach. The images were
marked.
IMPRESSION: Needle localization left breast breast. No apparent complications.

## 2018-02-28 ENCOUNTER — Other Ambulatory Visit: Payer: Self-pay | Admitting: Family Medicine

## 2018-02-28 DIAGNOSIS — N6320 Unspecified lump in the left breast, unspecified quadrant: Secondary | ICD-10-CM

## 2018-03-09 ENCOUNTER — Ambulatory Visit
Admission: RE | Admit: 2018-03-09 | Discharge: 2018-03-09 | Disposition: A | Discharge status: Home / Self Care | Payer: Medicare Other | Source: Ambulatory Visit | Attending: Family Medicine | Admitting: Family Medicine

## 2018-03-09 DIAGNOSIS — N6320 Unspecified lump in the left breast, unspecified quadrant: Secondary | ICD-10-CM

## 2018-05-10 ENCOUNTER — Other Ambulatory Visit: Payer: Self-pay | Admitting: Neurosurgery

## 2018-05-10 DIAGNOSIS — M542 Cervicalgia: Secondary | ICD-10-CM

## 2018-05-14 ENCOUNTER — Ambulatory Visit
Admission: RE | Admit: 2018-05-14 | Discharge: 2018-05-14 | Disposition: A | Discharge status: Home / Self Care | Payer: Medicare Other | Source: Ambulatory Visit | Attending: Neurosurgery | Admitting: Neurosurgery

## 2018-05-14 DIAGNOSIS — M542 Cervicalgia: Secondary | ICD-10-CM
